# Patient Record
Sex: Male | Born: 1960 | Race: White | Hispanic: No | Smoking: Former smoker
Health system: Southern US, Community
[De-identification: ages and names within clinical notes are randomized; demographics above are authoritative.]

## PROBLEM LIST (undated history)

## (undated) DIAGNOSIS — K5790 Diverticulosis of intestine, part unspecified, without perforation or abscess without bleeding: Secondary | ICD-10-CM

## (undated) DIAGNOSIS — K5780 Diverticulitis of intestine, part unspecified, with perforation and abscess without bleeding: Secondary | ICD-10-CM

## (undated) DIAGNOSIS — K219 Gastro-esophageal reflux disease without esophagitis: Secondary | ICD-10-CM

## (undated) DIAGNOSIS — Z720 Tobacco use: Secondary | ICD-10-CM

## (undated) DIAGNOSIS — Z8719 Personal history of other diseases of the digestive system: Secondary | ICD-10-CM

---

## 1999-02-07 ENCOUNTER — Encounter: Payer: Self-pay | Admitting: Emergency Medicine

## 1999-02-07 ENCOUNTER — Emergency Department (HOSPITAL_COMMUNITY): Admission: EM | Admit: 1999-02-07 | Discharge: 1999-02-07 | Payer: Self-pay | Admitting: Emergency Medicine

## 2003-09-02 ENCOUNTER — Encounter: Admission: RE | Admit: 2003-09-02 | Discharge: 2003-09-02 | Payer: Self-pay | Admitting: Family Medicine

## 2006-09-01 ENCOUNTER — Encounter: Admission: RE | Admit: 2006-09-01 | Discharge: 2006-09-01 | Payer: Self-pay | Admitting: Family Medicine

## 2012-02-08 ENCOUNTER — Encounter (HOSPITAL_COMMUNITY): Payer: Self-pay | Admitting: Family Medicine

## 2012-02-08 ENCOUNTER — Inpatient Hospital Stay (HOSPITAL_COMMUNITY)
Admission: EM | Admit: 2012-02-08 | Discharge: 2012-02-21 | DRG: 330 | Disposition: A | Discharge status: Home Health | Payer: 59 | Attending: Surgery | Admitting: Surgery

## 2012-02-08 ENCOUNTER — Emergency Department (HOSPITAL_COMMUNITY): Payer: 59

## 2012-02-08 ENCOUNTER — Ambulatory Visit (INDEPENDENT_AMBULATORY_CARE_PROVIDER_SITE_OTHER): Payer: 59 | Admitting: Emergency Medicine

## 2012-02-08 VITALS — BP 147/79 | HR 88 | Temp 97.5°F | Resp 18 | Ht 69.75 in | Wt 174.6 lb

## 2012-02-08 DIAGNOSIS — K5792 Diverticulitis of intestine, part unspecified, without perforation or abscess without bleeding: Secondary | ICD-10-CM

## 2012-02-08 DIAGNOSIS — Z8719 Personal history of other diseases of the digestive system: Secondary | ICD-10-CM

## 2012-02-08 DIAGNOSIS — D72829 Elevated white blood cell count, unspecified: Secondary | ICD-10-CM | POA: Diagnosis present

## 2012-02-08 DIAGNOSIS — K56609 Unspecified intestinal obstruction, unspecified as to partial versus complete obstruction: Secondary | ICD-10-CM | POA: Diagnosis not present

## 2012-02-08 DIAGNOSIS — K5732 Diverticulitis of large intestine without perforation or abscess without bleeding: Secondary | ICD-10-CM

## 2012-02-08 DIAGNOSIS — Z720 Tobacco use: Secondary | ICD-10-CM | POA: Diagnosis present

## 2012-02-08 DIAGNOSIS — Y9210 Unspecified residential institution as the place of occurrence of the external cause: Secondary | ICD-10-CM | POA: Diagnosis not present

## 2012-02-08 DIAGNOSIS — K929 Disease of digestive system, unspecified: Secondary | ICD-10-CM | POA: Diagnosis not present

## 2012-02-08 DIAGNOSIS — Z8711 Personal history of peptic ulcer disease: Secondary | ICD-10-CM

## 2012-02-08 DIAGNOSIS — K5790 Diverticulosis of intestine, part unspecified, without perforation or abscess without bleeding: Secondary | ICD-10-CM

## 2012-02-08 DIAGNOSIS — F17200 Nicotine dependence, unspecified, uncomplicated: Secondary | ICD-10-CM | POA: Diagnosis present

## 2012-02-08 DIAGNOSIS — Y833 Surgical operation with formation of external stoma as the cause of abnormal reaction of the patient, or of later complication, without mention of misadventure at the time of the procedure: Secondary | ICD-10-CM | POA: Diagnosis not present

## 2012-02-08 DIAGNOSIS — K630 Abscess of intestine: Secondary | ICD-10-CM | POA: Diagnosis not present

## 2012-02-08 DIAGNOSIS — K560 Paralytic ileus: Secondary | ICD-10-CM | POA: Diagnosis not present

## 2012-02-08 DIAGNOSIS — Z880 Allergy status to penicillin: Secondary | ICD-10-CM

## 2012-02-08 DIAGNOSIS — K5780 Diverticulitis of intestine, part unspecified, with perforation and abscess without bleeding: Secondary | ICD-10-CM | POA: Diagnosis present

## 2012-02-08 HISTORY — DX: Tobacco use: Z72.0

## 2012-02-08 HISTORY — DX: Diverticulosis of intestine, part unspecified, without perforation or abscess without bleeding: K57.90

## 2012-02-08 HISTORY — DX: Personal history of other diseases of the digestive system: Z87.19

## 2012-02-08 HISTORY — DX: Personal history of peptic ulcer disease: Z87.11

## 2012-02-08 HISTORY — DX: Diverticulitis of intestine, part unspecified, with perforation and abscess without bleeding: K57.80

## 2012-02-08 LAB — CBC WITH DIFFERENTIAL/PLATELET
Basophils Absolute: 0 10*3/uL (ref 0.0–0.1)
Basophils Relative: 0 % (ref 0–1)
Eosinophils Absolute: 0 10*3/uL (ref 0.0–0.7)
Eosinophils Relative: 0 % (ref 0–5)
HCT: 43 % (ref 39.0–52.0)
Hemoglobin: 15.2 g/dL (ref 13.0–17.0)
Lymphocytes Relative: 5 % — ABNORMAL LOW (ref 12–46)
Lymphs Abs: 1.1 10*3/uL (ref 0.7–4.0)
MCH: 32.2 pg (ref 26.0–34.0)
MCHC: 35.3 g/dL (ref 30.0–36.0)
MCV: 91.1 fL (ref 78.0–100.0)
Monocytes Absolute: 0.9 10*3/uL (ref 0.1–1.0)
Monocytes Relative: 4 % (ref 3–12)
Neutro Abs: 22.2 10*3/uL — ABNORMAL HIGH (ref 1.7–7.7)
Neutrophils Relative %: 92 % — ABNORMAL HIGH (ref 43–77)
Platelets: 286 10*3/uL (ref 150–400)
RBC: 4.72 MIL/uL (ref 4.22–5.81)
RDW: 12.7 % (ref 11.5–15.5)
WBC: 24.3 10*3/uL — ABNORMAL HIGH (ref 4.0–10.5)

## 2012-02-08 LAB — COMPREHENSIVE METABOLIC PANEL
ALT: 18 U/L (ref 0–53)
AST: 14 U/L (ref 0–37)
Albumin: 4.1 g/dL (ref 3.5–5.2)
Alkaline Phosphatase: 124 U/L — ABNORMAL HIGH (ref 39–117)
BUN: 17 mg/dL (ref 6–23)
CO2: 27 mEq/L (ref 19–32)
Calcium: 10.1 mg/dL (ref 8.4–10.5)
Chloride: 97 mEq/L (ref 96–112)
Creatinine, Ser: 0.91 mg/dL (ref 0.50–1.35)
GFR calc Af Amer: >90 mL/min (ref >90)
GFR calc non Af Amer: >90 mL/min (ref >90)
Glucose, Bld: 129 mg/dL — ABNORMAL HIGH (ref 70–99)
Potassium: 3.8 mEq/L (ref 3.5–5.1)
Sodium: 137 mEq/L (ref 135–145)
Total Bilirubin: 0.8 mg/dL (ref 0.3–1.2)
Total Protein: 8.1 g/dL (ref 6.0–8.3)

## 2012-02-08 LAB — CBC
HCT: 38.2 % — ABNORMAL LOW (ref 39.0–52.0)
MCV: 91 fL (ref 78.0–100.0)
RBC: 4.2 MIL/uL — ABNORMAL LOW (ref 4.22–5.81)
RDW: 12.8 % (ref 11.5–15.5)
WBC: 19 10*3/uL — ABNORMAL HIGH (ref 4.0–10.5)

## 2012-02-08 LAB — LACTIC ACID, PLASMA: Lactic Acid, Venous: 1 mmol/L (ref 0.5–2.2)

## 2012-02-08 LAB — CREATININE, SERUM: GFR calc Af Amer: >90 mL/min (ref >90)

## 2012-02-08 MED ORDER — CIPROFLOXACIN IN D5W 400 MG/200ML IV SOLN
400.0000 mg | Freq: Once | INTRAVENOUS | Status: AC
  Administered 2012-02-08: 400 mg via INTRAVENOUS
  Filled 2012-02-08: qty 200

## 2012-02-08 MED ORDER — FAMOTIDINE IN NACL 20-0.9 MG/50ML-% IV SOLN
20.0000 mg | Freq: Two times a day (BID) | INTRAVENOUS | Status: DC
  Administered 2012-02-08 – 2012-02-14 (×12): 20 mg via INTRAVENOUS
  Filled 2012-02-08 (×19): qty 50

## 2012-02-08 MED ORDER — ONDANSETRON HCL 4 MG/2ML IJ SOLN
4.0000 mg | Freq: Four times a day (QID) | INTRAMUSCULAR | Status: DC | PRN
  Administered 2012-02-08 – 2012-02-14 (×12): 4 mg via INTRAVENOUS
  Filled 2012-02-08 (×13): qty 2

## 2012-02-08 MED ORDER — METRONIDAZOLE IN NACL 5-0.79 MG/ML-% IV SOLN
500.0000 mg | Freq: Once | INTRAVENOUS | Status: AC
Start: 2012-02-08 — End: 2012-02-08
  Administered 2012-02-08: 500 mg via INTRAVENOUS

## 2012-02-08 MED ORDER — ONDANSETRON HCL 4 MG/2ML IJ SOLN
4.0000 mg | Freq: Once | INTRAMUSCULAR | Status: AC
  Administered 2012-02-08: 4 mg via INTRAVENOUS
  Filled 2012-02-08: qty 2

## 2012-02-08 MED ORDER — MORPHINE SULFATE 2 MG/ML IJ SOLN
1.0000 mg | INTRAMUSCULAR | Status: DC | PRN
  Administered 2012-02-08 (×2): 2 mg via INTRAVENOUS
  Filled 2012-02-08 (×2): qty 1

## 2012-02-08 MED ORDER — SODIUM CHLORIDE 0.9 % IV BOLUS (SEPSIS)
1000.0000 mL | Freq: Once | INTRAVENOUS | Status: AC
  Administered 2012-02-08: 1000 mL via INTRAVENOUS

## 2012-02-08 MED ORDER — ENOXAPARIN SODIUM 40 MG/0.4ML ~~LOC~~ SOLN
40.0000 mg | SUBCUTANEOUS | Status: DC
  Administered 2012-02-08 – 2012-02-13 (×6): 40 mg via SUBCUTANEOUS
  Filled 2012-02-08 (×8): qty 0.4

## 2012-02-08 MED ORDER — IOHEXOL 300 MG/ML  SOLN
100.0000 mL | Freq: Once | INTRAMUSCULAR | Status: AC | PRN
  Administered 2012-02-08: 100 mL via INTRAVENOUS

## 2012-02-08 MED ORDER — DIPHENHYDRAMINE HCL 50 MG/ML IJ SOLN: 12.5000 mg | Freq: Four times a day (QID) | INTRAMUSCULAR | Status: DC | PRN

## 2012-02-08 MED ORDER — ACETAMINOPHEN 650 MG RE SUPP: 650.0000 mg | Freq: Four times a day (QID) | RECTAL | Status: DC | PRN

## 2012-02-08 MED ORDER — HYDROMORPHONE HCL PF 1 MG/ML IJ SOLN
1.0000 mg | Freq: Once | INTRAMUSCULAR | Status: AC
  Administered 2012-02-08: 1 mg via INTRAVENOUS
  Filled 2012-02-08: qty 1

## 2012-02-08 MED ORDER — METRONIDAZOLE IN NACL 5-0.79 MG/ML-% IV SOLN
500.0000 mg | Freq: Three times a day (TID) | INTRAVENOUS | Status: DC
  Administered 2012-02-08 – 2012-02-12 (×11): 500 mg via INTRAVENOUS
  Filled 2012-02-08 (×15): qty 100

## 2012-02-08 MED ORDER — IOHEXOL 300 MG/ML  SOLN: 20.0000 mL | INTRAMUSCULAR | Status: AC

## 2012-02-08 MED ORDER — DIPHENHYDRAMINE HCL 12.5 MG/5ML PO ELIX
12.5000 mg | ORAL_SOLUTION | Freq: Four times a day (QID) | ORAL | Status: DC | PRN
  Filled 2012-02-08: qty 10

## 2012-02-08 MED ORDER — ONDANSETRON HCL 4 MG/2ML IJ SOLN
INTRAMUSCULAR | Status: AC
  Administered 2012-02-08: 4 mg via INTRAVENOUS
  Filled 2012-02-08: qty 2

## 2012-02-08 MED ORDER — ONDANSETRON HCL 4 MG/2ML IJ SOLN
4.0000 mg | Freq: Once | INTRAMUSCULAR | Status: AC
  Administered 2012-02-08: 4 mg via INTRAVENOUS

## 2012-02-08 MED ORDER — HYDROCODONE-ACETAMINOPHEN 5-325 MG PO TABS
1.0000 | ORAL_TABLET | ORAL | Status: DC | PRN
  Administered 2012-02-09 – 2012-02-13 (×2): 2 via ORAL
  Filled 2012-02-08 (×3): qty 2

## 2012-02-08 MED ORDER — CIPROFLOXACIN IN D5W 400 MG/200ML IV SOLN
400.0000 mg | Freq: Two times a day (BID) | INTRAVENOUS | Status: DC
  Administered 2012-02-09 – 2012-02-12 (×6): 400 mg via INTRAVENOUS
  Filled 2012-02-08 (×7): qty 200

## 2012-02-08 MED ORDER — POTASSIUM CHLORIDE IN NACL 20-0.9 MEQ/L-% IV SOLN
INTRAVENOUS | Status: DC
  Administered 2012-02-08 – 2012-02-10 (×4): via INTRAVENOUS
  Filled 2012-02-08 (×9): qty 1000

## 2012-02-08 MED ORDER — ACETAMINOPHEN 325 MG PO TABS
650.0000 mg | ORAL_TABLET | Freq: Four times a day (QID) | ORAL | Status: DC | PRN
  Administered 2012-02-08 – 2012-02-14 (×3): 650 mg via ORAL
  Filled 2012-02-08 (×3): qty 2

## 2012-02-08 NOTE — ED Notes (Signed)
Pt tolerating contrast well.

## 2012-02-08 NOTE — Progress Notes (Signed)
Patient adm. To rm [id number] via stretcher from ED. A/O, VSS, denies pain, no c/o nausea, ice chips given, patient oriented to room/unit. Wife at bedside.Lurline Idol [facility]

## 2012-02-08 NOTE — H&P (Signed)
Philip Jimenez is an 51 y.o. male.   Chief Complaint: Abdominal pain nausea and vomiting. GI: Dr. Marisue Brooklyn HPI: Patient is a 52 year old gentleman who's been in good health. On Tuesday he started having abdominal pain which got better after a bowel movement. He felt better on Tuesday the pain returned again on Thursday and Friday. Pain is significant her just to get out of bed. He has a history of diverticulitis and was treated back in December 2012 and subsequently underwent colonoscopy Dr. Loreta Ave in January 2013. He's continue to have worsening pain and chills no fever. Today he developed nausea vomiting and diarrhea. He presented to the ER at [facility]. WBC is 24,300. Hemoglobin 15 hematocrit 43 platelets 286,000 electrolytes and LFTs are all normal creatinine 0.91. CT scan shows significant sigmoid diverticulitis with evidence of microperforation with several small extraluminal locules of air. There is some prominent secondary inflammation and adjacent loop of the distal ileum, which may be a future abscess. Currently there is nothing drainable or obvious abscess at this time. Also has a small hiatal hernia and a left renal cyst.    Past Medical History  Diagnosis Date  . Tobacco use 02/08/2012    Less than 1ppd for 36 years  . Hx of gastric ulcer with bleeding about 20 years agol 02/08/2012  . Diverticulosis 02/08/2012    With diverticulitis DEC 2012. Colonoscopy Jan 2013 DR. Mann  . Diverticulitis with perforation 02/08/2012    History reviewed. No pertinent past surgical history.  History reviewed. No pertinent family history. Social History:  reports that he has been smoking.  He does not have any smokeless tobacco history on file. He reports that he does not drink alcohol. His drug history not on file.  Allergies:  Allergies  Allergen Reactions  . Penicillins Hives   Prior to Admission medications   Medication Sig Start Date End Date Taking? Authorizing Provider    acetaminophen (TYLENOL) 500 MG tablet Take 1,000 mg by mouth every 6 (six) hours as needed. For pain   Yes Historical Provider, MD  esomeprazole (NEXIUM) 40 MG capsule Take 40 mg by mouth daily before breakfast.   Yes Historical Provider, MD    (Not in a hospital admission)  Results for orders placed during the hospital encounter of 02/08/12 (from the past 48 hour(s))  CBC WITH DIFFERENTIAL     Status: Abnormal   Collection Time   02/08/12 10:01 AM      Component Value Range Comment   WBC 24.3 (*) 4.0 - 10.5 K/uL    RBC 4.72  4.22 - 5.81 MIL/uL    Hemoglobin 15.2  13.0 - 17.0 g/dL    HCT 13.0  86.5 - 78.4 %    MCV 91.1  78.0 - 100.0 fL    MCH 32.2  26.0 - 34.0 pg    MCHC 35.3  30.0 - 36.0 g/dL    RDW 69.6  29.5 - 28.4 %    Platelets 286  150 - 400 K/uL    Neutrophils Relative 92 (*) 43 - 77 %    Neutro Abs 22.2 (*) 1.7 - 7.7 K/uL    Lymphocytes Relative 5 (*) 12 - 46 %    Lymphs Abs 1.1  0.7 - 4.0 K/uL    Monocytes Relative 4  3 - 12 %    Monocytes Absolute 0.9  0.1 - 1.0 K/uL    Eosinophils Relative 0  0 - 5 %    Eosinophils Absolute 0.0  0.0 - 0.7 K/uL    Basophils Relative 0  0 - 1 %    Basophils Absolute 0.0  0.0 - 0.1 K/uL   COMPREHENSIVE METABOLIC PANEL     Status: Abnormal   Collection Time   02/08/12 10:01 AM      Component Value Range Comment   Sodium 137  135 - 145 mEq/L    Potassium 3.8  3.5 - 5.1 mEq/L    Chloride 97  96 - 112 mEq/L    CO2 27  19 - 32 mEq/L    Glucose, Bld 129 (*) 70 - 99 mg/dL    BUN 17  6 - 23 mg/dL    Creatinine, Ser 1.61  0.50 - 1.35 mg/dL    Calcium 09.6  8.4 - 10.5 mg/dL    Total Protein 8.1  6.0 - 8.3 g/dL    Albumin 4.1  3.5 - 5.2 g/dL    AST 14  0 - 37 U/L    ALT 18  0 - 53 U/L    Alkaline Phosphatase 124 (*) 39 - 117 U/L    Total Bilirubin 0.8  0.3 - 1.2 mg/dL    GFR calc non Af Amer >90  >90 mL/min    GFR calc Af Amer >90  >90 mL/min   LIPASE, BLOOD     Status: Normal   Collection Time   02/08/12 10:01 AM      Component Value  Range Comment   Lipase 13  11 - 59 U/L   LACTIC ACID, PLASMA     Status: Normal   Collection Time   02/08/12 11:10 AM      Component Value Range Comment   Lactic Acid, Venous 1.0  0.5 - 2.2 mmol/L    Ct Abdomen Pelvis W Contrast  02/08/2012  *RADIOLOGY REPORT*  Clinical Data: Abdominal pain.  CT ABDOMEN AND PELVIS WITH CONTRAST  Technique:  Multidetector CT imaging of the abdomen and pelvis was performed following the standard protocol during bolus administration of intravenous contrast.  Contrast: OMNIPAQUE IOHEXOL 300 MG/ML  SOLN  Comparison: None.  Findings: Dependent subsegmental atelectasis noted in the lower lobes.  Small hiatal hernia noted.  The liver, spleen, pancreas, and adrenal glands appear unremarkable.  The gallbladder and biliary system appear unremarkable.  No pathologic retroperitoneal or porta hepatis adenopathy is identified.  Right kidney lower pole exophytic cyst measures 2.3 cm in long axis.  Right kidney upper pole scar noted.  Left kidney unremarkable.  There is prominent sigmoid diverticulitis, with several tiny locules of extraluminal gas indicative of microperforation. Prominent wall thickening of the adjacent loop of distal ileum favors prominent secondary inflammation.  The terminal most ileum does not appear inflamed.  A tiny collection of gas and fluid adjacent to this loop is noted anteriorly on image 71 of series 2. Surrounding mesenteric and omental edema.  Urinary bladder unremarkable.    Central prostate gland calcifications noted.  Appendix normal.  Scattered colonic diverticula especially in the descending colon.  IMPRESSION: 1.  Prominent sigmoid diverticulitis with evidence of microperforation and several small extraluminal locules of gas. There is prominent secondary inflammation of the adjacent loop of distal ileum. Suspected incipient abscess formation anteriorly.  2.  Small hiatal hernia.  3.  Right kidney upper pole chronic scar.  4.  Small right kidney  lower pole exophytic cyst.  5.  Mild aortoiliac atherosclerotic calcification.   Original Report Authenticated By: Dellia Cloud, M.D.     Review of  Systems  Constitutional: Positive for chills and weight loss. Negative for fever, malaise/fatigue and diaphoresis.  HENT: Negative.   Eyes: Negative.   Respiratory: Negative.   Cardiovascular: Negative.   Gastrointestinal: Positive for heartburn, nausea, vomiting, abdominal pain and diarrhea. Negative for constipation, blood in stool and melena.  Genitourinary: Dysuria: 8 pounds last week.       Decreased urine output  Musculoskeletal: Negative.   Skin: Negative.   Neurological: Negative.  Negative for weakness.  Endo/Heme/Allergies: Negative.   Psychiatric/Behavioral: Negative.     Blood pressure 115/73, pulse 72, temperature 98.3 F (36.8 C), temperature source Oral, resp. rate 16, SpO2 97.00%. Physical Exam  Constitutional: He is oriented to person, place, and time. He appears well-developed. No distress.  HENT:  Head: Normocephalic and atraumatic.  Nose: Nose normal.  Eyes: Conjunctivae normal and EOM are normal. Pupils are equal, round, and reactive to light. Right eye exhibits no discharge. Left eye exhibits no discharge. No scleral icterus.  Neck: Normal range of motion. Neck supple. No JVD present. No tracheal deviation present. No thyromegaly present.  Cardiovascular: Regular rhythm, normal heart sounds and intact distal pulses.  Exam reveals no gallop.   No murmur heard.      Heart rate upper 90's now.  Respiratory: Effort normal and breath sounds normal. No stridor. No respiratory distress. He has no wheezes. He has no rales. He exhibits no tenderness.  GI: Soft. Bowel sounds are normal. He exhibits distension. He exhibits no mass (tender Both right and left, lower quadrantsmore on right than left right now.). There is tenderness. There is no rebound. Guarding: very tender more on right than left.  Musculoskeletal:  Normal range of motion. He exhibits no edema.  Lymphadenopathy:    He has no cervical adenopathy.  Neurological: He is alert and oriented to person, place, and time. He has normal reflexes. A cranial nerve deficit is present.  Skin: Skin is warm and dry. No rash noted. He is not diaphoretic. No erythema. There is pallor.       Feels like he has temp, oral temp 98.9  Psychiatric: He has a normal mood and affect. His behavior is normal. Judgment and thought content normal.     Assessment/Plan 1. Acute diverticulitis with microperforation 2. Diverticulitis December 2012, colonoscopy January 2013 Dr. Loreta Ave 3. History of GI bleed 20 years ago. 4. Tobacco Use  Plan: Will admit the patient. IV hydration, bowel rest, and IV antibiotic. We'll reassess tomorrow at this point we anticipate medical management. [facility] physician assistant for Dr. Gaynelle Adu.  Alessandra Sawdey 02/08/2012, 4:17 PM

## 2012-02-08 NOTE — ED Notes (Signed)
Pt knows that urine is needed. Pt is still unable to void at this time 

## 2012-02-08 NOTE — ED Notes (Signed)
Pt reports generalized abdominal pain after not having a BM for 3-4 days on Tuesday, reports taking prune juice and was able to have BM and felt better. Reports yesterday starting having severe abdominal pain again with n/v/d and mild fever. At this time pt states he feels better than he did yesterday but still has severe abdominal pain, right worse than left, severe tenderness with palpation.

## 2012-02-08 NOTE — ED Notes (Signed)
Dr. Wilson in to assess pt. 

## 2012-02-08 NOTE — Progress Notes (Addendum)
   Date:  02/08/2012   Name:  Philip Jimenez   DOB:  03-23-1961   MRN:  [id number] Gender: male Age: 51 y.o.  PCP:  Charna Elizabeth, MD    Chief Complaint: Diverticulitis and Emesis   History of Present Illness:  Philip Jimenez is a 51 y.o. pleasant patient who presents with the following:  Ill since Tuesday when constipated.  Worsened over past several days with increased abdominal pain.  Yesterday morning had a loose stool and today has had three loose stools and vomited three or more times.  No appetite and poor po intake. No fever or chills.  Now feels quite ill per patient.  Has a history of diverticulosis by colonoscopy.  No surgical history.  Prior episode of diverticulitis in January.  No vomiting blood, blood in stools or melena.  There is no problem list on file for this patient.   No past medical history on file.  No past surgical history on file.  History  Substance Use Topics  . Smoking status: Current Every Day Smoker  . Smokeless tobacco: Not on file  . Alcohol Use: Not on file    No family history on file.  Allergies  Allergen Reactions  . Penicillins Hives    Medication list has been reviewed and updated.  No outpatient prescriptions prior to visit.    Review of Systems:  As per HPI, otherwise negative.    Physical Examination: Filed Vitals:   02/08/12 0828  BP: 147/79  Pulse: 88  Temp: 97.5 F (36.4 C)  Resp: 18   Filed Vitals:   02/08/12 0828  Height: 5' 9.75" (1.772 m)  Weight: 174 lb 9.6 oz (79.198 kg)   Body mass index is 25.23 kg/(m^2). Ideal Body Weight: Weight in (lb) to have BMI = 25: 172.6   GEN: WDWN, NAD, Non-toxic, A & O x 3 HEENT: Atraumatic, Normocephalic. Neck supple. No masses, No LAD.  Oropharyngeal mucosa dry Ears and Nose: No external deformity. CV: RRR, No M/G/R. No JVD. No thrill. No extra heart sounds. PULM: CTA B, no wheezes, crackles, rhonchi. No retractions. No resp. distress. No accessory muscle use. ABD:  S,  No HSM.  Generally tender and guarding.  BS active.  Borborygmi in lower abdomen.  Tenderness worse in lower right and left abdomen EXTR: No c/c/e NEURO Normal gait.  PSYCH: Normally interactive. Conversant. Not depressed or anxious appearing.  Calm demeanor.    Assessment and Plan: Acute abdomen Dehydration. To ER  Carmelina Dane, MD I have reviewed and agree with documentation. Robert P. Merla Riches, M.D.

## 2012-02-08 NOTE — ED Provider Notes (Signed)
History     CSN: [id number]  Arrival date & time 02/08/12  0932   First MD Initiated Contact with Patient 02/08/12 1023      Chief Complaint  Patient presents with  . Abdominal Pain    (Consider location/radiation/quality/duration/timing/severity/associated sxs/prior treatment) HPI The patient presents with concerns of abdominal pain.  He notes that his pain began approximately 4 days ago, has been waxing and waning since that time.  The pain is diffuse, crampy, seemingly worse following by mouth intake.  He notes associated nausea, vomiting, diarrhea.  He also notes subjective fever, chills. He denies chest pain, dyspnea, headache, confusion. No clear alleviating factors. The patient has a history of diverticulitis, with medical management approximately 9 months ago. History reviewed. No pertinent past medical history.  History reviewed. No pertinent past surgical history.  History reviewed. No pertinent family history.  History  Substance Use Topics  . Smoking status: Current Every Day Smoker  . Smokeless tobacco: Not on file  . Alcohol Use: No      Review of Systems  Constitutional:       Per HPI, otherwise negative  HENT:       Per HPI, otherwise negative  Eyes: Negative.   Respiratory:       Per HPI, otherwise negative  Cardiovascular:       Per HPI, otherwise negative  Gastrointestinal: Positive for nausea, vomiting, abdominal pain and diarrhea. Negative for blood in stool.  Genitourinary: Negative.   Musculoskeletal:       Per HPI, otherwise negative  Skin: Negative.   Neurological: Negative for syncope.    Allergies  Penicillins  Home Medications   Current Outpatient Rx  Name Route Sig Dispense Refill  . ACETAMINOPHEN 500 MG PO TABS Oral Take 1,000 mg by mouth every 6 (six) hours as needed. For pain    . ESOMEPRAZOLE MAGNESIUM 40 MG PO CPDR Oral Take 40 mg by mouth daily before breakfast.      BP 152/87  Pulse 94  Temp 98.3 F (36.8 C) (Oral)   Resp 16  SpO2 96%  Physical Exam  Nursing note and vitals reviewed. Constitutional: He is oriented to person, place, and time. He appears well-developed. No distress.  HENT:  Head: Normocephalic and atraumatic.  Eyes: Conjunctivae normal and EOM are normal.  Cardiovascular: Normal rate and regular rhythm.   Pulmonary/Chest: Effort normal. No stridor. No respiratory distress.  Abdominal: Normal appearance. He exhibits no distension. There is generalized tenderness. There is guarding. There is no rigidity.  Musculoskeletal: He exhibits no edema.  Neurological: He is alert and oriented to person, place, and time.  Skin: Skin is warm and dry.  Psychiatric: He has a normal mood and affect.    ED Course  Procedures (including critical care time)  Labs Reviewed  CBC WITH DIFFERENTIAL - Abnormal; Notable for the following:    WBC 24.3 (*)     Neutrophils Relative 92 (*)     Neutro Abs 22.2 (*)     Lymphocytes Relative 5 (*)     All other components within normal limits  COMPREHENSIVE METABOLIC PANEL - Abnormal; Notable for the following:    Glucose, Bld 129 (*)     Alkaline Phosphatase 124 (*)     All other components within normal limits  LIPASE, BLOOD  URINALYSIS, ROUTINE W REFLEX MICROSCOPIC  LACTIC ACID, PLASMA   No results found.   No diagnosis found.  Pulse ox: 99%ra  1500: I discussed the case with Dr.  Wilson.  MDM  This patient with a prior episode of diverticulitis now presents with new abdominal pain, nausea, vomiting.  On exam he is uncomfortable appearing, with a tender abdomen, without frank peritonitis.  Given his history, description of symptoms, this concern was diverticulitis, versus other inflammatory or infectious etiology.  The patient's CT scan demonstrates findings consistent with diverticulitis, with possible early abscess formation and extraluminal hair.  Given his leukocytosis, he was started on antibiotics, I discussed the case with surgery.  The  patient will be admitted for further evaluation and management.  Gerhard Munch, MD 02/08/12 [phone]

## 2012-02-08 NOTE — ED Notes (Signed)
Pt knows that urine is needed. Pt is unable to void at this time.  

## 2012-02-08 NOTE — ED Notes (Signed)
Pt complaining of generalized abdominal pain with N,V,D severe since yesterday.

## 2012-02-08 NOTE — H&P (Signed)
I saw the patient, participated in the history, exam and medical decision making, and concur with the physician assistant's note above.  History as above.  Vital stable Alert, nad, nontoxic Soft, diffuse TTP. More TTP in LLQ/suprapubic. Some guarding. No RT or peritonitis.   Sigmoid diverticulitis with microperforation.  No gross free air. Non-toxic appearing. Vitals stable. No peritonitis. Will start with non-surgical management. Discussed diverticulitis with pt and wife. Drew diagrams. Discussed non-surgical and surgical management. We will initiate non-surgical management for now. Explained to pt that if he gets "sicker" we will have to alter treatment plan (surgery)   Mary Sella. Andrey Campanile, MD, FACS General, Bariatric, & Minimally Invasive Surgery [facility], [place]

## 2012-02-09 LAB — URINALYSIS, ROUTINE W REFLEX MICROSCOPIC
Glucose, UA: NEGATIVE mg/dL
Protein, ur: 30 mg/dL — AB
Specific Gravity, Urine: >1.046 — ABNORMAL HIGH (ref 1.005–1.030)
Urobilinogen, UA: 1 mg/dL (ref 0.0–1.0)

## 2012-02-09 LAB — CBC
HCT: 42.7 % (ref 39.0–52.0)
Hemoglobin: 14.7 g/dL (ref 13.0–17.0)
MCV: 92.6 fL (ref 78.0–100.0)
RBC: 4.61 MIL/uL (ref 4.22–5.81)
WBC: 25.6 10*3/uL — ABNORMAL HIGH (ref 4.0–10.5)

## 2012-02-09 LAB — BASIC METABOLIC PANEL
CO2: 23 mEq/L (ref 19–32)
Chloride: 99 mEq/L (ref 96–112)
Sodium: 137 mEq/L (ref 135–145)

## 2012-02-09 LAB — URINE MICROSCOPIC-ADD ON

## 2012-02-09 MED ORDER — HYDROMORPHONE HCL PF 1 MG/ML IJ SOLN
0.5000 mg | INTRAMUSCULAR | Status: DC | PRN
  Administered 2012-02-09: 0.5 mg via INTRAVENOUS
  Administered 2012-02-09 – 2012-02-14 (×18): 1 mg via INTRAVENOUS
  Filled 2012-02-09 (×21): qty 1

## 2012-02-09 MED ORDER — PROMETHAZINE HCL 25 MG/ML IJ SOLN
12.5000 mg | INTRAMUSCULAR | Status: DC | PRN
  Administered 2012-02-09 – 2012-02-10 (×4): 12.5 mg via INTRAVENOUS
  Filled 2012-02-09 (×7): qty 1

## 2012-02-09 NOTE — Progress Notes (Signed)
Subjective: Feels better, having less pain in bed and moving.  Still having some nausea, and pain.  Says morphine makes him feel bad.  Objective: Vital signs in last 24 hours: Temp:  [97.5 F (36.4 C)-100.2 F (37.9 C)] 97.9 F (36.6 C) (09/15 0630) Pulse Rate:  [72-94] 87  (09/15 0630) Resp:  [16-24] 20  (09/15 0630) BP: (103-152)/(66-87) 131/85 mmHg (09/15 0630) SpO2:  [94 %-99 %] 98 % (09/15 0630) Weight:  [78.926 kg (174 lb)-79.198 kg (174 lb 9.6 oz)] 78.926 kg (174 lb) (09/15 0150) Last BM Date: 02/07/12  Diet: npo, WBC is still up. Tm 100.2 better this AM Intake/Output from previous day: 09/14 0701 - 09/15 0700 In: 900 [I.V.:900] Out: 550 [Urine:550] Intake/Output this shift:    General appearance: alert, cooperative and no distress Resp: clear to auscultation bilaterally GI: soft, less tender than yesterday, still having some pain, more mid abdomen this AM.  Lab Results:   Basename 02/09/12 0420 02/08/12 1614  WBC 25.6* 19.0*  HGB 14.7 13.3  HCT 42.7 38.2*  PLT 277 227    BMET  Basename 02/09/12 0420 02/08/12 1614 02/08/12 1001  NA 137 -- 137  K 3.9 -- 3.8  CL 99 -- 97  CO2 23 -- 27  GLUCOSE 88 -- 129*  BUN 14 -- 17  CREATININE 0.85 0.78 --  CALCIUM 9.6 -- 10.1   PT/INR No results found for this basename: LABPROT:2,INR:2 in the last 72 hours   Lab 02/08/12 1001  AST 14  ALT 18  ALKPHOS 124*  BILITOT 0.8  PROT 8.1  ALBUMIN 4.1     Lipase     Component Value Date/Time   LIPASE 13 02/08/2012 1001     Studies/Results: Ct Abdomen Pelvis W Contrast  02/08/2012  *RADIOLOGY REPORT*  Clinical Data: Abdominal pain.  CT ABDOMEN AND PELVIS WITH CONTRAST  Technique:  Multidetector CT imaging of the abdomen and pelvis was performed following the standard protocol during bolus administration of intravenous contrast.  Contrast: OMNIPAQUE IOHEXOL 300 MG/ML  SOLN  Comparison: None.  Findings: Dependent subsegmental atelectasis noted in the lower  lobes.  Small hiatal hernia noted.  The liver, spleen, pancreas, and adrenal glands appear unremarkable.  The gallbladder and biliary system appear unremarkable.  No pathologic retroperitoneal or porta hepatis adenopathy is identified.  Right kidney lower pole exophytic cyst measures 2.3 cm in long axis.  Right kidney upper pole scar noted.  Left kidney unremarkable.  There is prominent sigmoid diverticulitis, with several tiny locules of extraluminal gas indicative of microperforation. Prominent wall thickening of the adjacent loop of distal ileum favors prominent secondary inflammation.  The terminal most ileum does not appear inflamed.  A tiny collection of gas and fluid adjacent to this loop is noted anteriorly on image 71 of series 2. Surrounding mesenteric and omental edema.  Urinary bladder unremarkable.    Central prostate gland calcifications noted.  Appendix normal.  Scattered colonic diverticula especially in the descending colon.  IMPRESSION: 1.  Prominent sigmoid diverticulitis with evidence of microperforation and several small extraluminal locules of gas. There is prominent secondary inflammation of the adjacent loop of distal ileum. Suspected incipient abscess formation anteriorly.  2.  Small hiatal hernia.  3.  Right kidney upper pole chronic scar.  4.  Small right kidney lower pole exophytic cyst.  5.  Mild aortoiliac atherosclerotic calcification.   Original Report Authenticated By: Dellia Cloud, M.D.     Medications:    . ciprofloxacin  400  mg Intravenous Once  . ciprofloxacin  400 mg Intravenous Q12H  . enoxaparin  40 mg Subcutaneous Q24H  . famotidine (PEPCID) IV  20 mg Intravenous Q12H  .  HYDROmorphone (DILAUDID) injection  1 mg Intravenous Once  .  HYDROmorphone (DILAUDID) injection  1 mg Intravenous Once  . iohexol  20 mL Oral Q1 Hr x 2  . metronidazole  500 mg Intravenous Once  . metronidazole  500 mg Intravenous Q8H  . ondansetron (ZOFRAN) IV  4 mg Intravenous Once    . ondansetron (ZOFRAN) IV  4 mg Intravenous Once  . ondansetron (ZOFRAN) IV  4 mg Intravenous Once  . sodium chloride  1,000 mL Intravenous Once    Assessment/Plan 1. Acute diverticulitis with microperforation  2. Diverticulitis December 2012, colonoscopy January 2013 Dr. Loreta Ave  3. History of GI bleed 20 years ago.  4. Tobacco Use  Plan:  D/c Morphine, change to dilaudid, continue antibiotics, add phenergan, continue to hydrate, give him some extra fluid today. Recheck labs AM.       LOS: 1 day    Yossi Hinchman 02/09/2012

## 2012-02-09 NOTE — Plan of Care (Signed)
Problem: Phase I Progression Outcomes Goal: OOB as tolerated unless otherwise ordered Outcome: Progressing Bathroom privilages

## 2012-02-09 NOTE — Progress Notes (Signed)
Agree with above. -Bowel rest -Abx

## 2012-02-10 LAB — BASIC METABOLIC PANEL
BUN: 15 mg/dL (ref 6–23)
Chloride: 101 mEq/L (ref 96–112)
GFR calc Af Amer: >90 mL/min (ref >90)
Glucose, Bld: 122 mg/dL — ABNORMAL HIGH (ref 70–99)
Potassium: 4 mEq/L (ref 3.5–5.1)

## 2012-02-10 LAB — CBC
HCT: 38.1 % — ABNORMAL LOW (ref 39.0–52.0)
Hemoglobin: 12.9 g/dL — ABNORMAL LOW (ref 13.0–17.0)
WBC: 17.3 10*3/uL — ABNORMAL HIGH (ref 4.0–10.5)

## 2012-02-10 MED ORDER — KCL IN DEXTROSE-NACL 20-5-0.9 MEQ/L-%-% IV SOLN
INTRAVENOUS | Status: DC
  Administered 2012-02-10 – 2012-02-11 (×3): via INTRAVENOUS
  Administered 2012-02-12: 1000 mL via INTRAVENOUS
  Filled 2012-02-10 (×9): qty 1000

## 2012-02-10 NOTE — Progress Notes (Signed)
Subjective: Has significantly less pain.  This is his first episode.  He can take Ampicillin.  Objective: Vital signs in last 24 hours: Temp:  [98.5 F (36.9 C)-99.1 F (37.3 C)] 98.8 F (37.1 C) (09/16 0603) Pulse Rate:  [73-93] 80  (09/16 0603) Resp:  [18-19] 18  (09/16 0603) BP: (101-122)/(60-86) 122/79 mmHg (09/16 0603) SpO2:  [96 %-98 %] 96 % (09/16 0603) Last BM Date: 02/07/12  Intake/Output from previous day: 09/15 0701 - 09/16 0700 In: 4008 [I.V.:3608; IV Piggyback:400] Out: -  Intake/Output this shift:    PE: Abd-soft, no significant tenderness  Lab Results:   [facility] 02/10/12 0610 02/09/12 0420  WBC 17.3* 25.6*  HGB 12.9* 14.7  HCT 38.1* 42.7  PLT 266 277   BMET  Basename 02/10/12 0610 02/09/12 0420  NA 138 137  K 4.0 3.9  CL 101 99  CO2 26 23  GLUCOSE 122* 88  BUN 15 14  CREATININE 0.85 0.85  CALCIUM 9.1 9.6   PT/INR No results found for this basename: LABPROT:2,INR:2 in the last 72 hours Comprehensive Metabolic Panel:    Component Value Date/Time   NA 138 02/10/2012 0610   K 4.0 02/10/2012 0610   CL 101 02/10/2012 0610   CO2 26 02/10/2012 0610   BUN 15 02/10/2012 0610   CREATININE 0.85 02/10/2012 0610   GLUCOSE 122* 02/10/2012 0610   CALCIUM 9.1 02/10/2012 0610   AST 14 02/08/2012 1001   ALT 18 02/08/2012 1001   ALKPHOS 124* 02/08/2012 1001   BILITOT 0.8 02/08/2012 1001   PROT 8.1 02/08/2012 1001   ALBUMIN 4.1 02/08/2012 1001     Studies/Results: Ct Abdomen Pelvis W Contrast  02/08/2012  *RADIOLOGY REPORT*  Clinical Data: Abdominal pain.  CT ABDOMEN AND PELVIS WITH CONTRAST  Technique:  Multidetector CT imaging of the abdomen and pelvis was performed following the standard protocol during bolus administration of intravenous contrast.  Contrast: OMNIPAQUE IOHEXOL 300 MG/ML  SOLN  Comparison: None.  Findings: Dependent subsegmental atelectasis noted in the lower lobes.  Small hiatal hernia noted.  The liver, spleen, pancreas, and adrenal  glands appear unremarkable.  The gallbladder and biliary system appear unremarkable.  No pathologic retroperitoneal or porta hepatis adenopathy is identified.  Right kidney lower pole exophytic cyst measures 2.3 cm in long axis.  Right kidney upper pole scar noted.  Left kidney unremarkable.  There is prominent sigmoid diverticulitis, with several tiny locules of extraluminal gas indicative of microperforation. Prominent wall thickening of the adjacent loop of distal ileum favors prominent secondary inflammation.  The terminal most ileum does not appear inflamed.  A tiny collection of gas and fluid adjacent to this loop is noted anteriorly on image 71 of series 2. Surrounding mesenteric and omental edema.  Urinary bladder unremarkable.    Central prostate gland calcifications noted.  Appendix normal.  Scattered colonic diverticula especially in the descending colon.  IMPRESSION: 1.  Prominent sigmoid diverticulitis with evidence of microperforation and several small extraluminal locules of gas. There is prominent secondary inflammation of the adjacent loop of distal ileum. Suspected incipient abscess formation anteriorly.  2.  Small hiatal hernia.  3.  Right kidney upper pole chronic scar.  4.  Small right kidney lower pole exophytic cyst.  5.  Mild aortoiliac atherosclerotic calcification.   Original Report Authenticated By: Dellia Cloud, M.D.     Anti-infectives: Anti-infectives     Start     Dose/Rate Route Frequency Ordered Stop   02/09/12 1400   ciprofloxacin (  CIPRO) IVPB 400 mg        400 mg 200 mL/hr over 60 Minutes Intravenous Every 12 hours 02/08/12 1556     02/08/12 2200   metroNIDAZOLE (FLAGYL) IVPB 500 mg        500 mg 100 mL/hr over 60 Minutes Intravenous Every 8 hours 02/08/12 1556     02/08/12 1445   ciprofloxacin (CIPRO) IVPB 400 mg        400 mg 200 mL/hr over 60 Minutes Intravenous  Once 02/08/12 1437 02/08/12 1545   02/08/12 1445   metroNIDAZOLE (FLAGYL) IVPB 500 mg         500 mg 100 mL/hr over 60 Minutes Intravenous  Once 02/08/12 1437 02/08/12 1935          Assessment Principal Problem:  *Diverticulitis with microperforation-clinically improving on IV abxs. Active Problems:  Tobacco use  Hx of gastric ulcer  Diverticulosis    LOS: 2 days   Plan: Continue abxs.  Sips of clear liquids.  Repeat CBC tomorrow.   Philip Jimenez 02/10/2012

## 2012-02-10 NOTE — Progress Notes (Signed)
UR complete 

## 2012-02-10 NOTE — Progress Notes (Signed)
INITIAL ADULT NUTRITION ASSESSMENT Date: 02/10/2012   Time: 5:10 PM Reason for Assessment: MST  ASSESSMENT: Male 51 y.o.  Dx: Diverticulitis with perforation  Hx:  Past Medical History  Diagnosis Date  . Tobacco use 02/08/2012    Less than 1ppd for 36 years  . Hx of gastric ulcer 02/08/2012  . Diverticulosis 02/08/2012    With diverticulitis DEC 2012. Colonoscopy Jan 2013 DR. Mann  . Diverticulitis with perforation 02/08/2012   History reviewed. No pertinent past surgical history.  Related Meds:  Scheduled Meds:   . ciprofloxacin  400 mg Intravenous Q12H  . enoxaparin  40 mg Subcutaneous Q24H  . famotidine (PEPCID) IV  20 mg Intravenous Q12H  . metronidazole  500 mg Intravenous Q8H   Continuous Infusions:   . dextrose 5 % and 0.9 % NaCl with KCl 20 mEq/L 125 mL/hr at 02/10/12 1045  . DISCONTD: 0.9 % NaCl with KCl 20 mEq / L 150 mL/hr at 02/10/12 0212   PRN Meds:.acetaminophen, acetaminophen, diphenhydrAMINE, diphenhydrAMINE, HYDROcodone-acetaminophen, HYDROmorphone (DILAUDID) injection, ondansetron, promethazine  Ht: 5\' 10"  (177.8 cm)  Wt: 174 lb (78.926 kg)  Ideal Wt: 166 lbs % Ideal Wt: 104%  Usual Wt: 180 per pt % Usual Wt: 96%  Body mass index is 24.97 kg/(m^2).  Food/Nutrition Related Hx: pt reports decreased intake, dehydration PTA  Labs:  CMP     Component Value Date/Time   NA 138 02/10/2012 0610   K 4.0 02/10/2012 0610   CL 101 02/10/2012 0610   CO2 26 02/10/2012 0610   GLUCOSE 122* 02/10/2012 0610   BUN 15 02/10/2012 0610   CREATININE 0.85 02/10/2012 0610   CALCIUM 9.1 02/10/2012 0610   PROT 8.1 02/08/2012 1001   ALBUMIN 4.1 02/08/2012 1001   AST 14 02/08/2012 1001   ALT 18 02/08/2012 1001   ALKPHOS 124* 02/08/2012 1001   BILITOT 0.8 02/08/2012 1001   GFRNONAA >90 02/10/2012 0610   GFRAA >90 02/10/2012 0610    CBC    Component Value Date/Time   WBC 17.3* 02/10/2012 0610   RBC 4.11* 02/10/2012 0610   HGB 12.9* 02/10/2012 0610   HCT 38.1* 02/10/2012 0610     PLT 266 02/10/2012 0610   MCV 92.7 02/10/2012 0610   MCH 31.4 02/10/2012 0610   MCHC 33.9 02/10/2012 0610   RDW 13.0 02/10/2012 0610   LYMPHSABS 1.1 02/08/2012 1001   MONOABS 0.9 02/08/2012 1001   EOSABS 0.0 02/08/2012 1001   BASOSABS 0.0 02/08/2012 1001    Intake: 100% clear liquids Output:   Intake/Output Summary (Last 24 hours) at 02/10/12 1712 Last data filed at 02/10/12 1413  Gross per 24 hour  Intake 3881.33 ml  Output    100 ml  Net 3781.33 ml    Diet Order: Clear Liquid  Supplements/Tube Feeding: none at this time  IVF:    dextrose 5 % and 0.9 % NaCl with KCl 20 mEq/L Last Rate: 125 mL/hr at 02/10/12 1045  DISCONTD: 0.9 % NaCl with KCl 20 mEq / L Last Rate: 150 mL/hr at 02/10/12 0212    Estimated Nutritional Needs:   Kcal: 1610-9604 Protein: 78-90g Fluid: >2.2 L/day  Pt states this is his first flare of diverticulitis since January.  He reports he is very picky about what he eats and cannot figure out what he ate that has caused his to have this flare. RD reviewed diet with pt and found pt is very strict about following recommendations for diverticulosis.  Pt avoids all seeds, nuts, and  undigestible foods.  He eliminates food with soft seeds- cucumbers, tomatoes, etc..  Pt states he has been trying to increase his fiber to 35g/day, but this has been difficult since he eliminated some seed-containing vegetables.  He also cut back on eating as much carbs which can be a good source of fiber.  Pt states that approximately 2 weeks ago, he and his wife went out of town and were so busy they did not get to eat a lot.  He wonders if this change in diet may have caused his flare.  Pt consumed 100% of clear liquid tray.  Review of wt shows pt with some wt loss, likely due to dehydration.  Will continue to monitor wt trends.  NUTRITION DIAGNOSIS: -Inadequate oral intake (NI-2.1) r/t omission of energy dense foods AEB pt with bowel rest for diverticulitis.  Status:  Ongoing  MONITORING/EVALUATION(Goals): 1.  Food/Beverage; diet advancement with tolerance 2.  Wt/wt change; monitor trends 3.  Knowledge; questions related to diverticulosis  EDUCATION NEEDS: -Education needs addressed  INTERVENTION: 1.  Brief education; provided to pt and wife. Discussed current management and how to resume nutrition therapy for diverticulosis after discharge.  No handouts provided as pt reports adequate resources at home.  Pt asks about inpatient videos.  Dietitian #: [phone]  DOCUMENTATION CODES Per approved criteria  -Not Applicable    Loyce Dys [facility] 02/10/2012, 5:10 PM

## 2012-02-11 LAB — CBC
Hemoglobin: 12.3 g/dL — ABNORMAL LOW (ref 13.0–17.0)
MCH: 31 pg (ref 26.0–34.0)
RBC: 3.97 MIL/uL — ABNORMAL LOW (ref 4.22–5.81)
WBC: 11.5 10*3/uL — ABNORMAL HIGH (ref 4.0–10.5)

## 2012-02-11 NOTE — Progress Notes (Signed)
Patient seen and examined.  Clinically improving.  Has a small supraumbilical bulge which may be a hernia but is asx.

## 2012-02-11 NOTE — Progress Notes (Signed)
  Subjective: He feels better, says he's bloated, having pain, still, better with pain medicine, He also had a new "knot," come up in his midline above the umbilicus yesterday.    Objective: Vital signs in last 24 hours: Temp:  [97.6 F (36.4 C)-98.3 F (36.8 C)] 97.6 F (36.4 C) (09/17 0526) Pulse Rate:  [71-81] 71  (09/17 0526) Resp:  [18-20] 20  (09/17 0526) BP: (133-139)/(90-96) 139/96 mmHg (09/17 0526) SpO2:  [95 %-100 %] 95 % (09/17 0526) Last BM Date: 02/10/12 Diet: clear liquids, BP up some, afebrile, WBC slowly improving Intake/Output from previous day: 09/16 0701 - 09/17 0700 In: 2908.8 [P.O.:240; I.V.:1868.8; IV Piggyback:800] Out: 100 [Urine:100] Intake/Output this shift:    General appearance: alert, cooperative and no distress Resp: clear to auscultation bilaterally GI: soft, he has what feels like a ventral hernia midline above umbilicus.  about 2 cm in diam. Tender in midline below umbilicus which is where he was most tender on Saturday on admission.  Lab Results:   Basename 02/11/12 0520 02/10/12 0610  WBC 11.5* 17.3*  HGB 12.3* 12.9*  HCT 36.3* 38.1*  PLT 279 266    BMET  Basename 02/10/12 0610 02/09/12 0420  NA 138 137  K 4.0 3.9  CL 101 99  CO2 26 23  GLUCOSE 122* 88  BUN 15 14  CREATININE 0.85 0.85  CALCIUM 9.1 9.6   PT/INR No results found for this basename: LABPROT:2,INR:2 in the last 72 hours   Lab 02/08/12 1001  AST 14  ALT 18  ALKPHOS 124*  BILITOT 0.8  PROT 8.1  ALBUMIN 4.1     Lipase     Component Value Date/Time   LIPASE 13 02/08/2012 1001     Studies/Results: No results found.  Medications:    . ciprofloxacin  400 mg Intravenous Q12H  . enoxaparin  40 mg Subcutaneous Q24H  . famotidine (PEPCID) IV  20 mg Intravenous Q12H  . metronidazole  500 mg Intravenous Q8H    Assessment/Plan *Diverticulitis with microperforation-clinically improving on IV abxs.  Active Problems:  Tobacco use  Hx of gastric ulcer    Diverticulosis   PLan:  Leave him on current diet, and antibiotics.   CT was 9/14, will most likely repeat CT 9/19 or9/20.  LOS: 3 days    Matas Burrows 02/11/2012

## 2012-02-12 MED ORDER — KCL IN DEXTROSE-NACL 20-5-0.9 MEQ/L-%-% IV SOLN
INTRAVENOUS | Status: DC
  Administered 2012-02-12: 21:00:00 via INTRAVENOUS
  Administered 2012-02-13: 1000 mL via INTRAVENOUS
  Administered 2012-02-13 – 2012-02-14 (×2): via INTRAVENOUS
  Filled 2012-02-12 (×8): qty 1000

## 2012-02-12 MED ORDER — SIMETHICONE 80 MG PO CHEW
80.0000 mg | CHEWABLE_TABLET | Freq: Once | ORAL | Status: AC
  Administered 2012-02-12: 80 mg via ORAL
  Filled 2012-02-12: qty 1

## 2012-02-12 MED ORDER — MORPHINE SULFATE 2 MG/ML IJ SOLN: 2.0000 mg | INTRAMUSCULAR | Status: DC | PRN

## 2012-02-12 MED ORDER — CIPROFLOXACIN HCL 500 MG PO TABS
500.0000 mg | ORAL_TABLET | Freq: Two times a day (BID) | ORAL | Status: DC
  Filled 2012-02-12 (×2): qty 1

## 2012-02-12 MED ORDER — CIPROFLOXACIN IN D5W 400 MG/200ML IV SOLN
400.0000 mg | Freq: Two times a day (BID) | INTRAVENOUS | Status: DC
  Administered 2012-02-13 – 2012-02-14 (×3): 400 mg via INTRAVENOUS
  Filled 2012-02-12 (×3): qty 200

## 2012-02-12 MED ORDER — METRONIDAZOLE 500 MG PO TABS
500.0000 mg | ORAL_TABLET | Freq: Three times a day (TID) | ORAL | Status: DC
  Administered 2012-02-12: 500 mg via ORAL
  Filled 2012-02-12 (×3): qty 1

## 2012-02-12 MED ORDER — METRONIDAZOLE IN NACL 5-0.79 MG/ML-% IV SOLN
500.0000 mg | Freq: Three times a day (TID) | INTRAVENOUS | Status: DC
Start: 2012-02-12 — End: 2012-02-14
  Administered 2012-02-13 – 2012-02-14 (×5): 500 mg via INTRAVENOUS
  Filled 2012-02-12 (×5): qty 100

## 2012-02-12 MED ORDER — ALUM & MAG HYDROXIDE-SIMETH 200-200-20 MG/5ML PO SUSP
15.0000 mL | Freq: Four times a day (QID) | ORAL | Status: DC | PRN
  Administered 2012-02-12: 15 mL via ORAL
  Filled 2012-02-12: qty 30

## 2012-02-12 NOTE — Progress Notes (Signed)
Patient ID: Philip Jimenez, male   DOB: [birth year], 51 y.o.   MRN: [id number]    Subjective: He feels better, having small BM, +flatus. Tender around umbilicus.  Objective: Vital signs in last 24 hours: Temp:  [96.8 F (36 C)-98.6 F (37 C)] 98.6 F (37 C) (09/18 0617) Pulse Rate:  [69-70] 70  (09/18 0617) Resp:  [17-18] 18  (09/18 0617) BP: (122-143)/(77-86) 143/77 mmHg (09/18 0617) SpO2:  [96 %-99 %] 98 % (09/18 0617) Last BM Date: 02/10/12 Diet: clear liquids, BP up some, afebrile, WBC slowly improving Intake/Output from previous day: 09/17 0701 - 09/18 0700 In: 4075.8 [P.O.:480; I.V.:3095.8; IV Piggyback:500] Out: -  Intake/Output this shift:    General appearance: A/A/O no distress Chest: CTA bilaterally Cardiac: RRR Abdomen: non distended, small ventral hernia (umbilicus) no bowel felt. Tender to palpation in this area but no more than previous exams per patient.  VSS, Labs stable, afebrile, WBC improving.  Lab Results:   Basename 02/11/12 0520 02/10/12 0610  WBC 11.5* 17.3*  HGB 12.3* 12.9*  HCT 36.3* 38.1*  PLT 279 266    BMET  Basename 02/10/12 0610  NA 138  K 4.0  CL 101  CO2 26  GLUCOSE 122*  BUN 15  CREATININE 0.85  CALCIUM 9.1   PT/INR No results found for this basename: LABPROT:2,INR:2 in the last 72 hours   Lab 02/08/12 1001  AST 14  ALT 18  ALKPHOS 124*  BILITOT 0.8  PROT 8.1  ALBUMIN 4.1     Lipase     Component Value Date/Time   LIPASE 13 02/08/2012 1001     Studies/Results: No results found.  Medications:    . ciprofloxacin  400 mg Intravenous Q12H  . enoxaparin  40 mg Subcutaneous Q24H  . famotidine (PEPCID) IV  20 mg Intravenous Q12H  . metronidazole  500 mg Intravenous Q8H    Assessment/Plan *Diverticulitis with microperforation-clinically improving on IV abxs.  Active Problems:  Tobacco use  Hx of gastric ulcer  Diverticulosis   PLan:  Continue current diet, and antibiotics. Will CT abdomen in  am. Encourage ambulation DC'd  IVF (saline locked IV) Transition to po abx    .  LOS: 4 days    Jeptha Hinnenkamp 02/12/2012

## 2012-02-12 NOTE — Progress Notes (Signed)
Patient seen and examined.  Agree with PA's note.  

## 2012-02-13 ENCOUNTER — Inpatient Hospital Stay (HOSPITAL_COMMUNITY): Payer: 59

## 2012-02-13 LAB — BASIC METABOLIC PANEL
CO2: 30 mEq/L (ref 19–32)
Calcium: 8.6 mg/dL (ref 8.4–10.5)
Creatinine, Ser: 0.83 mg/dL (ref 0.50–1.35)
GFR calc Af Amer: >90 mL/min (ref >90)
GFR calc non Af Amer: >90 mL/min (ref >90)

## 2012-02-13 LAB — CBC
MCH: 31 pg (ref 26.0–34.0)
MCHC: 34.1 g/dL (ref 30.0–36.0)
MCV: 91.1 fL (ref 78.0–100.0)
Platelets: 330 10*3/uL (ref 150–400)
RDW: 12.9 % (ref 11.5–15.5)

## 2012-02-13 MED ORDER — HYDROCORTISONE 1 % EX CREA
TOPICAL_CREAM | Freq: Two times a day (BID) | CUTANEOUS | Status: DC
  Administered 2012-02-13 – 2012-02-14 (×2): via TOPICAL
  Filled 2012-02-13: qty 28

## 2012-02-13 MED ORDER — IOHEXOL 300 MG/ML  SOLN
100.0000 mL | Freq: Once | INTRAMUSCULAR | Status: AC | PRN
  Administered 2012-02-13: 100 mL via INTRAVENOUS

## 2012-02-13 MED ORDER — IOHEXOL 300 MG/ML  SOLN
20.0000 mL | INTRAMUSCULAR | Status: AC
  Administered 2012-02-13: 20 mL via ORAL

## 2012-02-13 MED ORDER — BIOTENE DRY MOUTH MT LIQD
15.0000 mL | Freq: Two times a day (BID) | OROMUCOSAL | Status: DC
  Administered 2012-02-13 – 2012-02-14 (×2): 15 mL via OROMUCOSAL

## 2012-02-13 NOTE — Progress Notes (Signed)
Patient seen and examined.  I was hoping for better progress, thus, will get a repeat CT today.

## 2012-02-13 NOTE — Progress Notes (Signed)
Patient ID: ZIERE DOCKEN, male   DOB: [birth year], 51 y.o.   MRN: [id number]    Subjective: Feels better today, less bloating and pain, using Ice pack on abdomen, +BM, denies nausea/vomiting.  Objective: Vital signs in last 24 hours: Temp:  [97.9 F (36.6 C)-98.6 F (37 C)] 98.4 F (36.9 C) (09/19 0530) Pulse Rate:  [62-78] 62  (09/19 0530) Resp:  [16-18] 16  (09/19 0530) BP: (121-153)/(80-88) 135/80 mmHg (09/19 0530) SpO2:  [96 %-99 %] 96 % (09/19 0530) Last BM Date: 02/11/12 Diet: clear liquids, BP up some, afebrile, WBC slowly improving Intake/Output from previous day: 09/18 0701 - 09/19 0700 In: 1974.6 [P.O.:360; I.V.:1564.6; IV Piggyback:50] Out: 400 [Urine:400] Intake/Output this shift:   Physical Exam: General: awake, alert, NAD Lungs: CTA bilaterally Heart: RRR Abdomen: minimally tender over mid/lower abd with mass in area, rest of abd soft nontender.    Lab Results:   Basename 02/13/12 0520 02/11/12 0520  WBC 10.5 11.5*  HGB 12.2* 12.3*  HCT 35.8* 36.3*  PLT 330 279    BMET  Basename 02/13/12 0520  NA 138  K 3.4*  CL 103  CO2 30  GLUCOSE 121*  BUN 6  CREATININE 0.83  CALCIUM 8.6   PT/INR No results found for this basename: LABPROT:2,INR:2 in the last 72 hours   Lab 02/08/12 1001  AST 14  ALT 18  ALKPHOS 124*  BILITOT 0.8  PROT 8.1  ALBUMIN 4.1     Lipase     Component Value Date/Time   LIPASE 13 02/08/2012 1001     Studies/Results: No results found.  Medications:    . ciprofloxacin  400 mg Intravenous Q12H  . enoxaparin  40 mg Subcutaneous Q24H  . famotidine (PEPCID) IV  20 mg Intravenous Q12H  . metronidazole  500 mg Intravenous Q8H  . simethicone  80 mg Oral Once  . DISCONTD: ciprofloxacin  400 mg Intravenous Q12H  . DISCONTD: ciprofloxacin  500 mg Oral BID  . DISCONTD: metronidazole  500 mg Intravenous Q8H  . DISCONTD: metroNIDAZOLE  500 mg Oral Q8H    Assessment/Plan *Diverticulitis with microperforation-clinically  improving on IV abxs, WBC normalized  Active Problems:  Tobacco use  Hx of gastric ulcer  Diverticulosis  PLan:   --Leave at ice chips only today, advance diet tomorrow if cont to do better  --Hold on CT since improving today  --Encourage ambulation  --Cont cipro/flagyl IV today  --Transition to po abx tomorrow    .  LOS: 5 days    WHITE, ELIZABETH 02/13/2012

## 2012-02-14 ENCOUNTER — Encounter (HOSPITAL_COMMUNITY): Admission: EM | Disposition: A | Discharge status: Home Health | Payer: Self-pay | Source: Home / Self Care

## 2012-02-14 ENCOUNTER — Encounter (HOSPITAL_COMMUNITY): Payer: Self-pay | Admitting: Certified Registered"

## 2012-02-14 ENCOUNTER — Inpatient Hospital Stay (HOSPITAL_COMMUNITY): Payer: 59 | Admitting: Certified Registered"

## 2012-02-14 HISTORY — PX: COLON RESECTION: SHX5231

## 2012-02-14 LAB — PROTIME-INR
INR: 1.4 (ref 0.00–1.49)
Prothrombin Time: 16.8 seconds — ABNORMAL HIGH (ref 11.6–15.2)

## 2012-02-14 LAB — CBC
HCT: 37.1 % — ABNORMAL LOW (ref 39.0–52.0)
Hemoglobin: 12.6 g/dL — ABNORMAL LOW (ref 13.0–17.0)
MCH: 31 pg (ref 26.0–34.0)
MCHC: 34 g/dL (ref 30.0–36.0)
MCV: 91.2 fL (ref 78.0–100.0)
Platelets: 356 10*3/uL (ref 150–400)
RBC: 4.07 MIL/uL — ABNORMAL LOW (ref 4.22–5.81)
RDW: 12.8 % (ref 11.5–15.5)
WBC: 13.1 10*3/uL — ABNORMAL HIGH (ref 4.0–10.5)

## 2012-02-14 LAB — APTT: aPTT: 33 seconds (ref 24–37)

## 2012-02-14 LAB — MRSA PCR SCREENING: MRSA by PCR: NEGATIVE

## 2012-02-14 SURGERY — COLON RESECTION
Anesthesia: General | Site: Abdomen | Wound class: Dirty or Infected

## 2012-02-14 MED ORDER — PROPOFOL 10 MG/ML IV BOLUS
INTRAVENOUS | Status: DC | PRN
  Administered 2012-02-14: 200 mg via INTRAVENOUS

## 2012-02-14 MED ORDER — ENOXAPARIN SODIUM 40 MG/0.4ML ~~LOC~~ SOLN
40.0000 mg | SUBCUTANEOUS | Status: DC
  Administered 2012-02-15 – 2012-02-20 (×6): 40 mg via SUBCUTANEOUS
  Filled 2012-02-14 (×7): qty 0.4

## 2012-02-14 MED ORDER — SODIUM CHLORIDE 0.9 % IV SOLN
1.0000 g | INTRAVENOUS | Status: DC
  Administered 2012-02-15 – 2012-02-20 (×6): 1 g via INTRAVENOUS
  Filled 2012-02-14 (×7): qty 1

## 2012-02-14 MED ORDER — MORPHINE SULFATE (PF) 1 MG/ML IV SOLN
INTRAVENOUS | Status: DC
  Administered 2012-02-14: 10.5 mg via INTRAVENOUS
  Administered 2012-02-14: 19:00:00 via INTRAVENOUS
  Filled 2012-02-14: qty 25

## 2012-02-14 MED ORDER — PANTOPRAZOLE SODIUM 40 MG IV SOLR
40.0000 mg | INTRAVENOUS | Status: DC
  Administered 2012-02-14 – 2012-02-20 (×7): 40 mg via INTRAVENOUS
  Filled 2012-02-14 (×8): qty 40

## 2012-02-14 MED ORDER — SODIUM CHLORIDE 0.9 % IJ SOLN: 9.0000 mL | INTRAMUSCULAR | Status: DC | PRN

## 2012-02-14 MED ORDER — ONDANSETRON HCL 4 MG/2ML IJ SOLN: 4.0000 mg | Freq: Four times a day (QID) | INTRAMUSCULAR | Status: DC | PRN

## 2012-02-14 MED ORDER — HYDROMORPHONE 0.3 MG/ML IV SOLN
INTRAVENOUS | Status: DC
  Administered 2012-02-14: 22:00:00 via INTRAVENOUS
  Administered 2012-02-15: 4.5 mg via INTRAVENOUS
  Administered 2012-02-15: 1.2 mg via INTRAVENOUS
  Administered 2012-02-15 (×2): via INTRAVENOUS
  Administered 2012-02-15: 6.46 mg via INTRAVENOUS
  Administered 2012-02-15: 3.3 mg via INTRAVENOUS
  Administered 2012-02-15 – 2012-02-16 (×2): via INTRAVENOUS
  Administered 2012-02-16: 2.9 mg via INTRAVENOUS
  Administered 2012-02-16 (×2): 1.5 mg via INTRAVENOUS
  Administered 2012-02-16: 1.2 mg via INTRAVENOUS
  Administered 2012-02-16: 14:00:00 via INTRAVENOUS
  Administered 2012-02-16: 3.6 mg via INTRAVENOUS
  Administered 2012-02-17 (×2): 1.5 mg via INTRAVENOUS
  Administered 2012-02-17 (×2): 0.6 mg via INTRAVENOUS
  Administered 2012-02-17: 0.3 mg via INTRAVENOUS
  Filled 2012-02-14 (×6): qty 25

## 2012-02-14 MED ORDER — NEOSTIGMINE METHYLSULFATE 1 MG/ML IJ SOLN
INTRAMUSCULAR | Status: DC | PRN
  Administered 2012-02-14: 5 mg via INTRAVENOUS

## 2012-02-14 MED ORDER — DIPHENHYDRAMINE HCL 12.5 MG/5ML PO ELIX
12.5000 mg | ORAL_SOLUTION | Freq: Four times a day (QID) | ORAL | Status: DC | PRN
  Filled 2012-02-14: qty 5

## 2012-02-14 MED ORDER — ONDANSETRON HCL 4 MG/2ML IJ SOLN
4.0000 mg | Freq: Four times a day (QID) | INTRAMUSCULAR | Status: DC | PRN
  Administered 2012-02-15 – 2012-02-20 (×3): 4 mg via INTRAVENOUS
  Filled 2012-02-14 (×3): qty 2

## 2012-02-14 MED ORDER — KCL IN DEXTROSE-NACL 20-5-0.9 MEQ/L-%-% IV SOLN
INTRAVENOUS | Status: DC
  Administered 2012-02-14 – 2012-02-21 (×16): via INTRAVENOUS
  Filled 2012-02-14 (×22): qty 1000

## 2012-02-14 MED ORDER — ESMOLOL HCL 10 MG/ML IV SOLN
INTRAVENOUS | Status: DC | PRN
  Administered 2012-02-14: 30 mg via INTRAVENOUS

## 2012-02-14 MED ORDER — GLYCOPYRROLATE 0.2 MG/ML IJ SOLN
INTRAMUSCULAR | Status: DC | PRN
  Administered 2012-02-14: .8 mg via INTRAVENOUS

## 2012-02-14 MED ORDER — LIDOCAINE HCL (CARDIAC) 20 MG/ML IV SOLN
INTRAVENOUS | Status: DC | PRN
  Administered 2012-02-14: 100 mg via INTRAVENOUS
  Administered 2012-02-14: 40 mg via INTRAVENOUS

## 2012-02-14 MED ORDER — HYDROMORPHONE HCL PF 1 MG/ML IJ SOLN
0.2500 mg | INTRAMUSCULAR | Status: DC | PRN
  Administered 2012-02-14 (×4): 0.5 mg via INTRAVENOUS

## 2012-02-14 MED ORDER — 0.9 % SODIUM CHLORIDE (POUR BTL) OPTIME
TOPICAL | Status: DC | PRN
  Administered 2012-02-14: 4000 mL

## 2012-02-14 MED ORDER — SODIUM CHLORIDE 0.9 % IV SOLN
1.0000 g | INTRAVENOUS | Status: DC
  Administered 2012-02-14: 1 g via INTRAVENOUS
  Filled 2012-02-14: qty 1

## 2012-02-14 MED ORDER — LACTATED RINGERS IV SOLN
INTRAVENOUS | Status: DC | PRN
  Administered 2012-02-14 (×3): via INTRAVENOUS

## 2012-02-14 MED ORDER — MORPHINE SULFATE (PF) 1 MG/ML IV SOLN
INTRAVENOUS | Status: AC
  Filled 2012-02-14: qty 25

## 2012-02-14 MED ORDER — SUCCINYLCHOLINE CHLORIDE 20 MG/ML IJ SOLN
INTRAMUSCULAR | Status: DC | PRN
  Administered 2012-02-14: 120 mg via INTRAVENOUS

## 2012-02-14 MED ORDER — ROCURONIUM BROMIDE 100 MG/10ML IV SOLN
INTRAVENOUS | Status: DC | PRN
  Administered 2012-02-14: 50 mg via INTRAVENOUS
  Administered 2012-02-14: 10 mg via INTRAVENOUS

## 2012-02-14 MED ORDER — NALOXONE HCL 0.4 MG/ML IJ SOLN: 0.4000 mg | INTRAMUSCULAR | Status: DC | PRN

## 2012-02-14 MED ORDER — SODIUM CHLORIDE 0.9 % IV SOLN
1.0000 g | INTRAVENOUS | Status: DC
  Filled 2012-02-14: qty 1

## 2012-02-14 MED ORDER — HYDROMORPHONE HCL PF 1 MG/ML IJ SOLN
INTRAMUSCULAR | Status: AC
  Filled 2012-02-14: qty 1

## 2012-02-14 MED ORDER — DIPHENHYDRAMINE HCL 50 MG/ML IJ SOLN: 12.5000 mg | Freq: Four times a day (QID) | INTRAMUSCULAR | Status: DC | PRN

## 2012-02-14 MED ORDER — ONDANSETRON HCL 4 MG PO TABS: 4.0000 mg | ORAL_TABLET | Freq: Four times a day (QID) | ORAL | Status: DC | PRN

## 2012-02-14 MED ORDER — FENTANYL CITRATE 0.05 MG/ML IJ SOLN
INTRAMUSCULAR | Status: DC | PRN
  Administered 2012-02-14: 100 ug via INTRAVENOUS
  Administered 2012-02-14: 150 ug via INTRAVENOUS
  Administered 2012-02-14 (×2): 100 ug via INTRAVENOUS
  Administered 2012-02-14: 50 ug via INTRAVENOUS

## 2012-02-14 MED ORDER — ONDANSETRON HCL 4 MG/2ML IJ SOLN
INTRAMUSCULAR | Status: DC | PRN
  Administered 2012-02-14: 4 mg via INTRAVENOUS

## 2012-02-14 MED ORDER — MIDAZOLAM HCL 5 MG/5ML IJ SOLN
INTRAMUSCULAR | Status: DC | PRN
  Administered 2012-02-14: 2 mg via INTRAVENOUS

## 2012-02-14 SURGICAL SUPPLY — 45 items
BLADE SURG ROTATE 9660 (MISCELLANEOUS) ×1 IMPLANT
BRR ADH 5X3 SEPRAFILM 6 SHT (MISCELLANEOUS) ×1
CANISTER SUCTION 2500CC (MISCELLANEOUS) ×2 IMPLANT
CHLORAPREP W/TINT 26ML (MISCELLANEOUS) ×2 IMPLANT
CLOTH BEACON ORANGE TIMEOUT ST (SAFETY) ×2 IMPLANT
COVER SURGICAL LIGHT HANDLE (MISCELLANEOUS) ×2 IMPLANT
DRAPE LAPAROSCOPIC ABDOMINAL (DRAPES) ×2 IMPLANT
DRAPE PROXIMA HALF (DRAPES) IMPLANT
DRAPE WARM FLUID 44X44 (DRAPE) ×2 IMPLANT
DRSG PAD ABDOMINAL 8X10 ST (GAUZE/BANDAGES/DRESSINGS) ×1 IMPLANT
ELECT BLADE 6.5 EXT (BLADE) ×1 IMPLANT
ELECT REM PT RETURN 9FT ADLT (ELECTROSURGICAL) ×2
ELECTRODE REM PT RTRN 9FT ADLT (ELECTROSURGICAL) ×1 IMPLANT
GLOVE BIOGEL M STRL SZ7.5 (GLOVE) ×1 IMPLANT
GLOVE BIOGEL PI IND STRL 8 (GLOVE) ×1 IMPLANT
GLOVE BIOGEL PI INDICATOR 8 (GLOVE) ×2
GLOVE ECLIPSE 8.0 STRL XLNG CF (GLOVE) ×4 IMPLANT
GOWN PREVENTION PLUS XXLARGE (GOWN DISPOSABLE) ×1 IMPLANT
GOWN STRL NON-REIN LRG LVL3 (GOWN DISPOSABLE) ×6 IMPLANT
KIT BASIN OR (CUSTOM PROCEDURE TRAY) ×2 IMPLANT
KIT ROOM TURNOVER OR (KITS) ×2 IMPLANT
LIGASURE IMPACT 36 18CM CVD LR (INSTRUMENTS) ×1 IMPLANT
NS IRRIG 1000ML POUR BTL (IV SOLUTION) ×4 IMPLANT
PACK GENERAL/GYN (CUSTOM PROCEDURE TRAY) ×2 IMPLANT
PAD ARMBOARD 7.5X6 YLW CONV (MISCELLANEOUS) ×2 IMPLANT
RELOAD PROXIMATE 75MM BLUE (ENDOMECHANICALS) ×2 IMPLANT
RELOAD STAPLE 75 3.8 BLU REG (ENDOMECHANICALS) IMPLANT
SEPRAFILM PROCEDURAL PACK 3X5 (MISCELLANEOUS) ×1 IMPLANT
SPECIMEN JAR X LARGE (MISCELLANEOUS) ×2 IMPLANT
SPONGE GAUZE 4X4 12PLY (GAUZE/BANDAGES/DRESSINGS) ×2 IMPLANT
SPONGE LAP 18X18 X RAY DECT (DISPOSABLE) ×1 IMPLANT
STAPLER PROXIMATE 75MM BLUE (STAPLE) ×1 IMPLANT
STAPLER VISISTAT 35W (STAPLE) ×2 IMPLANT
SUCTION POOLE TIP (SUCTIONS) ×2 IMPLANT
SUT PDS AB 1 TP1 96 (SUTURE) ×4 IMPLANT
SUT PROLENE 2 0 CT2 30 (SUTURE) ×1 IMPLANT
SUT VIC AB 2-0 SH 18 (SUTURE) ×3 IMPLANT
SUT VIC AB 3-0 SH 18 (SUTURE) ×2 IMPLANT
SUT VICRYL AB 2 0 TIES (SUTURE) ×2 IMPLANT
SUT VLOC 180 0 24IN GS25 (SUTURE) ×1 IMPLANT
TOWEL OR 17X24 6PK STRL BLUE (TOWEL DISPOSABLE) ×2 IMPLANT
TOWEL OR 17X26 10 PK STRL BLUE (TOWEL DISPOSABLE) ×2 IMPLANT
TRAY FOLEY CATH 14FRSI W/METER (CATHETERS) ×2 IMPLANT
WATER STERILE IRR 1000ML POUR (IV SOLUTION) ×2 IMPLANT
YANKAUER SUCT BULB TIP NO VENT (SUCTIONS) ×2 IMPLANT

## 2012-02-14 NOTE — Anesthesia Preprocedure Evaluation (Addendum)
Anesthesia Evaluation  Patient identified by MRN, date of birth, ID band Patient awake    Reviewed: Allergy & Precautions, H&P , NPO status , Patient's Chart, lab work & pertinent test results  History of Anesthesia Complications Negative for: history of anesthetic complications  Airway Mallampati: I TM Distance: >3 FB Neck ROM: Full    Dental  (+) Teeth Intact and Dental Advisory Given   Pulmonary Current Smoker,    Pulmonary exam normal       Cardiovascular negative cardio ROS      Neuro/Psych    GI/Hepatic Neg liver ROS, GERD-  Medicated and Controlled,Diverticulitis, SBO, ongoing nausea with clear liquids   Endo/Other  negative endocrine ROS  Renal/GU negative Renal ROS     Musculoskeletal negative musculoskeletal ROS (+)   Abdominal   Peds  Hematology negative hematology ROS (+)   Anesthesia Other Findings   Reproductive/Obstetrics                           Anesthesia Physical Anesthesia Plan  ASA: II  Anesthesia Plan: General   Post-op Pain Management:    Induction: Intravenous  Airway Management Planned: Oral ETT  Additional Equipment:   Intra-op Plan:   Post-operative Plan: Extubation in OR  Informed Consent: I have reviewed the patients History and Physical, chart, labs and discussed the procedure including the risks, benefits and alternatives for the proposed anesthesia with the patient or authorized representative who has indicated his/her understanding and acceptance.     Plan Discussed with: CRNA and Surgeon  Anesthesia Plan Comments:         Anesthesia Quick Evaluation

## 2012-02-14 NOTE — Anesthesia Procedure Notes (Signed)
Procedure Name: Intubation Date/Time: 02/14/2012 3:57 PM Performed by: Margaree Mackintosh Pre-anesthesia Checklist: Patient identified, Timeout performed, Emergency Drugs available, Suction available and Patient being monitored Patient Re-evaluated:Patient Re-evaluated prior to inductionOxygen Delivery Method: Circle system utilized Preoxygenation: Pre-oxygenation with 100% oxygen Intubation Type: IV induction and Rapid sequence Laryngoscope Size: Mac and 3 Grade View: Grade I Tube type: Oral Tube size: 7.5 mm Number of attempts: 1 Airway Equipment and Method: Stylet Placement Confirmation: ETT inserted through vocal cords under direct vision,  positive ETCO2 and breath sounds checked- equal and bilateral Secured at: 21 cm Tube secured with: Tape Dental Injury: Teeth and Oropharynx as per pre-operative assessment

## 2012-02-14 NOTE — Preoperative (Signed)
Beta Blockers   Reason not to administer Beta Blockers:Not Applicable 

## 2012-02-14 NOTE — Progress Notes (Signed)
Patient ID: Philip Jimenez, male   DOB: [birth year], 51 y.o.   MRN: [id number] Request received for aspiration/possible drainage of a diverticular abscess on pt. Imaging studies were reviewed by Dr. Grace Isaac and [facility] as below. Exam: pt awake/alert; chest- CTA with sl dim BS bases; heart-RRR; abd- soft, +BS, NT.     Filed Vitals:   02/13/12 0530 02/13/12 1446 02/13/12 2130 02/14/12 0622  BP: 135/80 149/93 150/78 162/87  Pulse: 62 56 64 73  Temp: 98.4 F (36.9 C) 98.2 F (36.8 C) 98.4 F (36.9 C) 98 F (36.7 C)  TempSrc: Oral Oral Oral   Resp: 16 16 18 18   Height:      Weight:      SpO2: 96% 96% 96% 95%   Past Medical History  Diagnosis Date  . Tobacco use 02/08/2012    Less than 1ppd for 36 years  . Hx of gastric ulcer 02/08/2012  . Diverticulosis 02/08/2012    With diverticulitis DEC 2012. Colonoscopy Jan 2013 DR. Mann  . Diverticulitis with perforation 02/08/2012   History reviewed. No pertinent past surgical history. Ct Abdomen Pelvis W Contrast  02/13/2012  *RADIOLOGY REPORT*  Clinical Data: Sigmoid diverticulitis.  Abdominal pain and distension.  CT ABDOMEN AND PELVIS WITH CONTRAST  Technique:  Multidetector CT imaging of the abdomen and pelvis was performed following the standard protocol during bolus administration of intravenous contrast.  Contrast: OMNIPAQUE IOHEXOL 300 MG/ML  SOLN  Comparison: CT of the abdomen and pelvis 02/08/2012.  Findings:  Lung Bases: Compared to the prior examination there are now small bilateral pleural effusions (right greater than left) with some passive atelectasis in the lower lobes of the lungs bilaterally. Small hiatal hernia.  Abdomen/Pelvis:  The bile within the lumen of the gallbladder is slightly high attenuation, which likely represents some vicarious excretion of contrast material from recent CT of abdomen and pelvis 02/08/2012.  The appearance of the liver, pancreas, spleen and bilateral adrenal glands is unremarkable.  Multiple low attenuation  renal lesions are again noted bilaterally, largest of which is 2.5 cm in the interpolar region of the right kidney, compatible with a cyst.  Smaller renal lesions are too small to definitively characterize.  Compared to the prior examination there is now marked dilatation of the small bowel, measuring up to 4.9 cm in diameter.  This predominantly effects the mid small bowel (jejunum), and there appears to be a transition to small caliber thick-walled bowel in the region of the distal and terminal ileum.  This portion of the small bowel was immediately adjacent to inflammatory changes from the patient's sigmoid diverticulitis.  Notably, there is a large amount of oral contrast in the more proximal small bowel, however, there is also oral contrast which has traversed through the area of distal ileal narrowing and is noted throughout the colon extending to the level of the rectum.  Numerous colonic diverticula are again noted, and there continues to be some inflammatory changes around the midsigmoid colon compatible with diverticulitis.  Images 67 - 74 clearly demonstrate an extraluminal collection of gas (with a trace amount of fluid), likely to represent a contained perforation within the mesentery. This has increased in size compared to the prior study.  There is also a small volume of ascites layering dependently within the pelvis.  At this time, this does not appear to exert significant mass effect upon adjacent structures (i.e., this does not appear to represent an abscess).  Musculoskeletal: There are no aggressive appearing lytic or  blastic lesions noted in the visualized portions of the skeleton.  IMPRESSION: 1.  Sigmoid diverticulitis with an enlarging extraluminal gas and fluid collection in the adjacent small bowel mesentery, favored to represent a contained perforation (likely a developing abscess). There is also a small volume of ascites in the pelvis which, at this time, it does not appear to represents  an abscess. 2.  Inflammatory changes of the sigmoid colon appear to be affecting the adjacent distal small bowel where there is marked bowel wall thickening and narrowing of the lumen of the distal and terminal ileum.  This is associated with marked dilatation of the more proximal small bowel, however, contrast is present within the colon extending to the level of the distal rectum.  Findings may simply reflect a secondary adynamic ileus related to inflammation, however, partial small bowel obstruction is difficult to entirely exclude, and clinical correlation is recommended. 3. New small bilateral pleural effusions with dependent passive atelectasis in the lower lobes of the lungs bilaterally. 4.  Additional incidental findings, as above.   Original Report Authenticated By: Florencia Reasons, M.D.    Ct Abdomen Pelvis W Contrast  02/08/2012  *RADIOLOGY REPORT*  Clinical Data: Abdominal pain.  CT ABDOMEN AND PELVIS WITH CONTRAST  Technique:  Multidetector CT imaging of the abdomen and pelvis was performed following the standard protocol during bolus administration of intravenous contrast.  Contrast: OMNIPAQUE IOHEXOL 300 MG/ML  SOLN  Comparison: None.  Findings: Dependent subsegmental atelectasis noted in the lower lobes.  Small hiatal hernia noted.  The liver, spleen, pancreas, and adrenal glands appear unremarkable.  The gallbladder and biliary system appear unremarkable.  No pathologic retroperitoneal or porta hepatis adenopathy is identified.  Right kidney lower pole exophytic cyst measures 2.3 cm in long axis.  Right kidney upper pole scar noted.  Left kidney unremarkable.  There is prominent sigmoid diverticulitis, with several tiny locules of extraluminal gas indicative of microperforation. Prominent wall thickening of the adjacent loop of distal ileum favors prominent secondary inflammation.  The terminal most ileum does not appear inflamed.  A tiny collection of gas and fluid adjacent to this loop  is noted anteriorly on image 71 of series 2. Surrounding mesenteric and omental edema.  Urinary bladder unremarkable.    Central prostate gland calcifications noted.  Appendix normal.  Scattered colonic diverticula especially in the descending colon.  IMPRESSION: 1.  Prominent sigmoid diverticulitis with evidence of microperforation and several small extraluminal locules of gas. There is prominent secondary inflammation of the adjacent loop of distal ileum. Suspected incipient abscess formation anteriorly.  2.  Small hiatal hernia.  3.  Right kidney upper pole chronic scar.  4.  Small right kidney lower pole exophytic cyst.  5.  Mild aortoiliac atherosclerotic calcification.   Original Report Authenticated By: Dellia Cloud, M.D.   Results for orders placed during the hospital encounter of 02/08/12  CBC WITH DIFFERENTIAL      Component Value Range   WBC 24.3 (*) 4.0 - 10.5 K/uL   RBC 4.72  4.22 - 5.81 MIL/uL   Hemoglobin 15.2  13.0 - 17.0 g/dL   HCT 84.6  96.2 - 95.2 %   MCV 91.1  78.0 - 100.0 fL   MCH 32.2  26.0 - 34.0 pg   MCHC 35.3  30.0 - 36.0 g/dL   RDW 84.1  32.4 - 40.1 %   Platelets 286  150 - 400 K/uL   Neutrophils Relative 92 (*) 43 - 77 %  Neutro Abs 22.2 (*) 1.7 - 7.7 K/uL   Lymphocytes Relative 5 (*) 12 - 46 %   Lymphs Abs 1.1  0.7 - 4.0 K/uL   Monocytes Relative 4  3 - 12 %   Monocytes Absolute 0.9  0.1 - 1.0 K/uL   Eosinophils Relative 0  0 - 5 %   Eosinophils Absolute 0.0  0.0 - 0.7 K/uL   Basophils Relative 0  0 - 1 %   Basophils Absolute 0.0  0.0 - 0.1 K/uL  COMPREHENSIVE METABOLIC PANEL      Component Value Range   Sodium 137  135 - 145 mEq/L   Potassium 3.8  3.5 - 5.1 mEq/L   Chloride 97  96 - 112 mEq/L   CO2 27  19 - 32 mEq/L   Glucose, Bld 129 (*) 70 - 99 mg/dL   BUN 17  6 - 23 mg/dL   Creatinine, Ser 0.27  0.50 - 1.35 mg/dL   Calcium 25.3  8.4 - 66.4 mg/dL   Total Protein 8.1  6.0 - 8.3 g/dL   Albumin 4.1  3.5 - 5.2 g/dL   AST 14  0 - 37 U/L   ALT 18   0 - 53 U/L   Alkaline Phosphatase 124 (*) 39 - 117 U/L   Total Bilirubin 0.8  0.3 - 1.2 mg/dL   GFR calc non Af Amer >90  >90 mL/min   GFR calc Af Amer >90  >90 mL/min  LIPASE, BLOOD      Component Value Range   Lipase 13  11 - 59 U/L  URINALYSIS, ROUTINE W REFLEX MICROSCOPIC      Component Value Range   Color, Urine YELLOW  YELLOW   APPearance CLEAR  CLEAR   Specific Gravity, Urine >1.046 (*) 1.005 - 1.030   pH 6.0  5.0 - 8.0   Glucose, UA NEGATIVE  NEGATIVE mg/dL   Hgb urine dipstick SMALL (*) NEGATIVE   Bilirubin Urine SMALL (*) NEGATIVE   Ketones, ur >80 (*) NEGATIVE mg/dL   Protein, ur 30 (*) NEGATIVE mg/dL   Urobilinogen, UA 1.0  0.0 - 1.0 mg/dL   Nitrite NEGATIVE  NEGATIVE   Leukocytes, UA NEGATIVE  NEGATIVE  LACTIC ACID, PLASMA      Component Value Range   Lactic Acid, Venous 1.0  0.5 - 2.2 mmol/L  CBC      Component Value Range   WBC 19.0 (*) 4.0 - 10.5 K/uL   RBC 4.20 (*) 4.22 - 5.81 MIL/uL   Hemoglobin 13.3  13.0 - 17.0 g/dL   HCT 40.3 (*) 47.4 - 25.9 %   MCV 91.0  78.0 - 100.0 fL   MCH 31.7  26.0 - 34.0 pg   MCHC 34.8  30.0 - 36.0 g/dL   RDW 56.3  87.5 - 64.3 %   Platelets 227  150 - 400 K/uL  CREATININE, SERUM      Component Value Range   Creatinine, Ser 0.78  0.50 - 1.35 mg/dL   GFR calc non Af Amer >90  >90 mL/min   GFR calc Af Amer >90  >90 mL/min  BASIC METABOLIC PANEL      Component Value Range   Sodium 137  135 - 145 mEq/L   Potassium 3.9  3.5 - 5.1 mEq/L   Chloride 99  96 - 112 mEq/L   CO2 23  19 - 32 mEq/L   Glucose, Bld 88  70 - 99 mg/dL   BUN 14  6 -  23 mg/dL   Creatinine, Ser 9.60  0.50 - 1.35 mg/dL   Calcium 9.6  8.4 - 45.4 mg/dL   GFR calc non Af Amer >90  >90 mL/min   GFR calc Af Amer >90  >90 mL/min  CBC      Component Value Range   WBC 25.6 (*) 4.0 - 10.5 K/uL   RBC 4.61  4.22 - 5.81 MIL/uL   Hemoglobin 14.7  13.0 - 17.0 g/dL   HCT 09.8  11.9 - 14.7 %   MCV 92.6  78.0 - 100.0 fL   MCH 31.9  26.0 - 34.0 pg   MCHC 34.4  30.0 -  36.0 g/dL   RDW 82.9  56.2 - 13.0 %   Platelets 277  150 - 400 K/uL  URINE MICROSCOPIC-ADD ON      Component Value Range   RBC / HPF 0-2  <3 RBC/hpf   Bacteria, UA RARE  RARE   Urine-Other MUCOUS PRESENT    CBC      Component Value Range   WBC 17.3 (*) 4.0 - 10.5 K/uL   RBC 4.11 (*) 4.22 - 5.81 MIL/uL   Hemoglobin 12.9 (*) 13.0 - 17.0 g/dL   HCT 86.5 (*) 78.4 - 69.6 %   MCV 92.7  78.0 - 100.0 fL   MCH 31.4  26.0 - 34.0 pg   MCHC 33.9  30.0 - 36.0 g/dL   RDW 29.5  28.4 - 13.2 %   Platelets 266  150 - 400 K/uL  BASIC METABOLIC PANEL      Component Value Range   Sodium 138  135 - 145 mEq/L   Potassium 4.0  3.5 - 5.1 mEq/L   Chloride 101  96 - 112 mEq/L   CO2 26  19 - 32 mEq/L   Glucose, Bld 122 (*) 70 - 99 mg/dL   BUN 15  6 - 23 mg/dL   Creatinine, Ser 4.40  0.50 - 1.35 mg/dL   Calcium 9.1  8.4 - 10.2 mg/dL   GFR calc non Af Amer >90  >90 mL/min   GFR calc Af Amer >90  >90 mL/min  MAGNESIUM      Component Value Range   Magnesium 1.8  1.5 - 2.5 mg/dL  CBC      Component Value Range   WBC 11.5 (*) 4.0 - 10.5 K/uL   RBC 3.97 (*) 4.22 - 5.81 MIL/uL   Hemoglobin 12.3 (*) 13.0 - 17.0 g/dL   HCT 72.5 (*) 36.6 - 44.0 %   MCV 91.4  78.0 - 100.0 fL   MCH 31.0  26.0 - 34.0 pg   MCHC 33.9  30.0 - 36.0 g/dL   RDW 34.7  42.5 - 95.6 %   Platelets 279  150 - 400 K/uL  CBC      Component Value Range   WBC 10.5  4.0 - 10.5 K/uL   RBC 3.93 (*) 4.22 - 5.81 MIL/uL   Hemoglobin 12.2 (*) 13.0 - 17.0 g/dL   HCT 38.7 (*) 56.4 - 33.2 %   MCV 91.1  78.0 - 100.0 fL   MCH 31.0  26.0 - 34.0 pg   MCHC 34.1  30.0 - 36.0 g/dL   RDW 95.1  88.4 - 16.6 %   Platelets 330  150 - 400 K/uL  BASIC METABOLIC PANEL      Component Value Range   Sodium 138  135 - 145 mEq/L   Potassium 3.4 (*) 3.5 - 5.1 mEq/L  Chloride 103  96 - 112 mEq/L   CO2 30  19 - 32 mEq/L   Glucose, Bld 121 (*) 70 - 99 mg/dL   BUN 6  6 - 23 mg/dL   Creatinine, Ser 1.61  0.50 - 1.35 mg/dL   Calcium 8.6  8.4 - 09.6 mg/dL   GFR  calc non Af Amer >90  >90 mL/min   GFR calc Af Amer >90  >90 mL/min  CBC      Component Value Range   WBC 13.1 (*) 4.0 - 10.5 K/uL   RBC 4.07 (*) 4.22 - 5.81 MIL/uL   Hemoglobin 12.6 (*) 13.0 - 17.0 g/dL   HCT 04.5 (*) 40.9 - 81.1 %   MCV 91.2  78.0 - 100.0 fL   MCH 31.0  26.0 - 34.0 pg   MCHC 34.0  30.0 - 36.0 g/dL   RDW 91.4  78.2 - 95.6 %   Platelets 356  150 - 400 K/uL   A/P: Pt with diverticular abscess; plan is for CT guided aspiration/possible drainage of abscess on 9/21. Details/risks of procedure d/w pt with his understanding and consent.

## 2012-02-14 NOTE — Transfer of Care (Signed)
Immediate Anesthesia Transfer of Care Note  Patient: Philip Jimenez  Procedure(s) Performed: Procedure(s) (LRB) with comments: COLON RESECTION (N/A)  Patient Location: PACU  Anesthesia Type: General  Level of Consciousness: awake, alert , oriented and patient cooperative  Airway & Oxygen Therapy: Patient Spontanous Breathing and Patient connected to face mask oxygen  Post-op Assessment: Report given to PACU RN  Post vital signs: Reviewed and stable  Complications: No apparent anesthesia complications

## 2012-02-14 NOTE — Op Note (Signed)
Operative Note  Philip Jimenez male 51 y.o. 02/14/2012  PREOPERATIVE DX:  Perforated sigmoid diverticulitis with small bowel obstruction  POSTOPERATIVE DX:  Same  PROCEDURE:  Exploratory laparotomy, lysis of adhesions, sigmoid colectomy, colostomy         Surgeon: Adolph Pollack   Assistants: Gaynelle Adu M.D.  Anesthesia: General endotracheal anesthesia  Indications:  This is a 51 year old male admitted 6 days ago with sigmoid diverticulitis and a microperforation but more abscess. Initially he was responding to medical treatment but today became worse with respect to his clinical status and a CT scan yesterday and is slightly worsening picture of his diverticulitis and an involving small bowel obstruction. Because of his worsening status he is brought to the operating room for the above procedure.    Procedure Detail:  He was brought to the operating room placed supine on the operating table and general anesthetic was administered. A Foley catheter and NG tube were inserted. The hair on the abdominal wall was clipped. The abdominal wall was widely sterilely prepped and draped.  A midline incision was made dividing the skin, subcutaneous tissue, and fascia and entered the peritoneal cavity. The small intestine was diffusely dilated down to a point of obstruction in the distal ileum. This area was adherent to the microperforated area of the sigmoid colon. Using careful blunt dissection electrocautery freed up this adhesion and this helped relieve the obstruction. I then divided some other adhesions of the terminal ileum to mobilize this.  I mobilized the sigmoid colon and distal portion of the left colon by dividing the lateral attachments.  Then I identified the rectosigmoid junction and divided the colon here where it was soft and normal appearing. The area of diverticulitis with microperforation in duration was well proximal to this. I then divided the sigmoid colon descending colon  junction with the GIA stapler. I mark the rectal stump with two 2-0 Prolene sutures. I then divided the mesentery of the sigmoid colon close to the bowel wall with the LigaSure and also use suture ligatures to ligate the vessels. The proximal portion the specimen was marked with a suture and it was sent off to pathology.  I had adequate mobilization of the descending colon to perform a colostomy.  Following this I then milked back the small intestinal contents back into the stomach and evacuated this decompressing the small bowel. The small bowel was inspected and there is no evidence of enterotomy.  The peritoneal cavity was then copiously irrigated out with saline solution the solution evacuated as much as possible. No abscess cavities were noted.   In the left lower quadrant area a circular skin and subcutaneous tissue incision was made. The anterior fascia was identified and a cruciate incision made in it. The peritoneum and posterior fascia identified a cruciate incision made in these. This track was dilated to 2 fingerbreadths. Descending colostomy stump was then brought through this incision and the descending colon anchored to the anterior fascia with interrupted 2-0 Vicryl sutures.  Following this I placed Seprafilm over the rectal stump area and a over the omentum which I placed over the small bowel. I then closed the fascia with running double looped PDS suture and intermittent #1 Novafil sutures. I approximated the skin in the periumbilical area and left the rest of the subcutaneous tissue opened.  The colostomy was then matured using interrupted 3-0 Vicryl sutures. A colostomy appliance was applied. Open portions of the wound then packed with saline moistened gauze followed by bulky  dry dressing.  He tolerated the procedure well without any apparent complications and was taken to the recovery room in satisfactory condition.   Estimated Blood Loss:  200 mL         Drains: none  Blood  Given: none          Specimens: Sigmoid colon        Complications:  * No complications entered in OR log *         Disposition: PACU - hemodynamically stable.         Condition: stable

## 2012-02-14 NOTE — Progress Notes (Signed)
Nutrition Follow-up  Intervention:   1.  Parenteral nutrition; consider initiation of nutrition support if pt not expected to advance PO diet within 48 hrs or slow advancement anticipated.  PharmD to manage.  Assessment:   Pt remains NPO due to diverticular abscess.  Pt may require surgical intervention, however is planning for IR drain placement tomorrow (9/21). Pt has been NPO/clear liquids x6 days with suspected fair nutrition status PTA.  Diet Order: NPO  Meds: Scheduled Meds:   . antiseptic oral rinse  15 mL Mouth Rinse BID  . ertapenem  1 g Intravenous Q24H  . famotidine (PEPCID) IV  20 mg Intravenous Q12H  . hydrocortisone cream   Topical BID  . iohexol  20 mL Oral Q1 Hr x 2  . DISCONTD: ciprofloxacin  400 mg Intravenous Q12H  . DISCONTD: enoxaparin  40 mg Subcutaneous Q24H  . DISCONTD: metronidazole  500 mg Intravenous Q8H   Continuous Infusions:   . dextrose 5 % and 0.9 % NaCl with KCl 20 mEq/L 125 mL/hr at 02/14/12 0744   PRN Meds:.acetaminophen, acetaminophen, alum & mag hydroxide-simeth, diphenhydrAMINE, diphenhydrAMINE, HYDROcodone-acetaminophen, HYDROmorphone (DILAUDID) injection, iohexol, morphine injection, ondansetron, promethazine  Labs:  CMP     Component Value Date/Time   NA 138 02/13/2012 0520   K 3.4* 02/13/2012 0520   CL 103 02/13/2012 0520   CO2 30 02/13/2012 0520   GLUCOSE 121* 02/13/2012 0520   BUN 6 02/13/2012 0520   CREATININE 0.83 02/13/2012 0520   CALCIUM 8.6 02/13/2012 0520   PROT 8.1 02/08/2012 1001   ALBUMIN 4.1 02/08/2012 1001   AST 14 02/08/2012 1001   ALT 18 02/08/2012 1001   ALKPHOS 124* 02/08/2012 1001   BILITOT 0.8 02/08/2012 1001   GFRNONAA >90 02/13/2012 0520   GFRAA >90 02/13/2012 0520     Intake/Output Summary (Last 24 hours) at 02/14/12 1247 Last data filed at 02/14/12 0519  Gross per 24 hour  Intake 3578.63 ml  Output      0 ml  Net 3578.63 ml    Weight Status:  No new wt Last known wt (9/14):  174 lbs  Re-estimated needs:   1980-2210 kcal, 78-90g protein, >2.2 L/day  Nutrition Dx:  Inadequate oral intake, ongoing  Monitor:   1. Food/Beverage; diet advancement with tolerance.  Not met, pt remains NPO 2. Wt/wt change; monitor trends.  Not met, no new wt 3. Knowledge; questions related to diverticulosis.  Ongoing.     Loyce Dys, MS RD LDN Clinical Inpatient Dietitian Pager: [phone] Weekend/After hours pager: [phone]

## 2012-02-14 NOTE — Progress Notes (Signed)
Subjective: He feels better today than he did yesterday.  He is passing a lot of gas and has had multiple BMs throughout the night.   Objective: Vital signs in last 24 hours: Temp:  [98 F (36.7 C)-98.4 F (36.9 C)] 98 F (36.7 C) (09/20 0622) Pulse Rate:  [56-73] 73  (09/20 0622) Resp:  [16-18] 18  (09/20 0622) BP: (149-162)/(78-93) 162/87 mmHg (09/20 0622) SpO2:  [95 %-96 %] 95 % (09/20 0622) Last BM Date: 02/11/12  Intake/Output from previous day: 09/19 0701 - 09/20 0700 In: 3578.6 [I.V.:2778.6; IV Piggyback:800] Out: -  Intake/Output this shift:    PE: Abd-soft, nontender, active bowel sounds  Lab Results:   Basename 02/14/12 0620 02/13/12 0520  WBC 13.1* 10.5  HGB 12.6* 12.2*  HCT 37.1* 35.8*  PLT 356 330   BMET  Basename 02/13/12 0520  NA 138  K 3.4*  CL 103  CO2 30  GLUCOSE 121*  BUN 6  CREATININE 0.83  CALCIUM 8.6   PT/INR No results found for this basename: LABPROT:2,INR:2 in the last 72 hours Comprehensive Metabolic Panel:    Component Value Date/Time   NA 138 02/13/2012 0520   K 3.4* 02/13/2012 0520   CL 103 02/13/2012 0520   CO2 30 02/13/2012 0520   BUN 6 02/13/2012 0520   CREATININE 0.83 02/13/2012 0520   GLUCOSE 121* 02/13/2012 0520   CALCIUM 8.6 02/13/2012 0520   AST 14 02/08/2012 1001   ALT 18 02/08/2012 1001   ALKPHOS 124* 02/08/2012 1001   BILITOT 0.8 02/08/2012 1001   PROT 8.1 02/08/2012 1001   ALBUMIN 4.1 02/08/2012 1001     Studies/Results: Ct Abdomen Pelvis W Contrast  02/13/2012  *RADIOLOGY REPORT*  Clinical Data: Sigmoid diverticulitis.  Abdominal pain and distension.  CT ABDOMEN AND PELVIS WITH CONTRAST  Technique:  Multidetector CT imaging of the abdomen and pelvis was performed following the standard protocol during bolus administration of intravenous contrast.  Contrast: OMNIPAQUE IOHEXOL 300 MG/ML  SOLN  Comparison: CT of the abdomen and pelvis 02/08/2012.  Findings:  Lung Bases: Compared to the prior examination there are  now small bilateral pleural effusions (right greater than left) with some passive atelectasis in the lower lobes of the lungs bilaterally. Small hiatal hernia.  Abdomen/Pelvis:  The bile within the lumen of the gallbladder is slightly high attenuation, which likely represents some vicarious excretion of contrast material from recent CT of abdomen and pelvis 02/08/2012.  The appearance of the liver, pancreas, spleen and bilateral adrenal glands is unremarkable.  Multiple low attenuation renal lesions are again noted bilaterally, largest of which is 2.5 cm in the interpolar region of the right kidney, compatible with a cyst.  Smaller renal lesions are too small to definitively characterize.  Compared to the prior examination there is now marked dilatation of the small bowel, measuring up to 4.9 cm in diameter.  This predominantly effects the mid small bowel (jejunum), and there appears to be a transition to small caliber thick-walled bowel in the region of the distal and terminal ileum.  This portion of the small bowel was immediately adjacent to inflammatory changes from the patient's sigmoid diverticulitis.  Notably, there is a large amount of oral contrast in the more proximal small bowel, however, there is also oral contrast which has traversed through the area of distal ileal narrowing and is noted throughout the colon extending to the level of the rectum.  Numerous colonic diverticula are again noted, and there continues to be some  inflammatory changes around the midsigmoid colon compatible with diverticulitis.  Images 67 - 74 clearly demonstrate an extraluminal collection of gas (with a trace amount of fluid), likely to represent a contained perforation within the mesentery. This has increased in size compared to the prior study.  There is also a small volume of ascites layering dependently within the pelvis.  At this time, this does not appear to exert significant mass effect upon adjacent structures (i.e.,  this does not appear to represent an abscess).  Musculoskeletal: There are no aggressive appearing lytic or blastic lesions noted in the visualized portions of the skeleton.  IMPRESSION: 1.  Sigmoid diverticulitis with an enlarging extraluminal gas and fluid collection in the adjacent small bowel mesentery, favored to represent a contained perforation (likely a developing abscess). There is also a small volume of ascites in the pelvis which, at this time, it does not appear to represents an abscess. 2.  Inflammatory changes of the sigmoid colon appear to be affecting the adjacent distal small bowel where there is marked bowel wall thickening and narrowing of the lumen of the distal and terminal ileum.  This is associated with marked dilatation of the more proximal small bowel, however, contrast is present within the colon extending to the level of the distal rectum.  Findings may simply reflect a secondary adynamic ileus related to inflammation, however, partial small bowel obstruction is difficult to entirely exclude, and clinical correlation is recommended. 3. New small bilateral pleural effusions with dependent passive atelectasis in the lower lobes of the lungs bilaterally. 4.  Additional incidental findings, as above.   Original Report Authenticated By: Florencia Reasons, M.D.     Anti-infectives: Anti-infectives     Start     Dose/Rate Route Frequency Ordered Stop   02/12/12 2000   ciprofloxacin (CIPRO) tablet 500 mg  Status:  Discontinued        500 mg Oral 2 times daily 02/12/12 0929 02/12/12 1640   02/12/12 1645   ciprofloxacin (CIPRO) IVPB 400 mg        400 mg 200 mL/hr over 60 Minutes Intravenous Every 12 hours 02/12/12 1641     02/12/12 1645   metroNIDAZOLE (FLAGYL) IVPB 500 mg        500 mg 100 mL/hr over 60 Minutes Intravenous Every 8 hours 02/12/12 1641     02/12/12 1400   metroNIDAZOLE (FLAGYL) tablet 500 mg  Status:  Discontinued        500 mg Oral 3 times per day 02/12/12 0930  02/12/12 1640   02/09/12 1400   ciprofloxacin (CIPRO) IVPB 400 mg  Status:  Discontinued        400 mg 200 mL/hr over 60 Minutes Intravenous Every 12 hours 02/08/12 1556 02/12/12 0928   02/08/12 2200   metroNIDAZOLE (FLAGYL) IVPB 500 mg  Status:  Discontinued        500 mg 100 mL/hr over 60 Minutes Intravenous Every 8 hours 02/08/12 1556 02/12/12 0928   02/08/12 1445   ciprofloxacin (CIPRO) IVPB 400 mg        400 mg 200 mL/hr over 60 Minutes Intravenous  Once 02/08/12 1437 02/08/12 1545   02/08/12 1445   metroNIDAZOLE (FLAGYL) IVPB 500 mg        500 mg 100 mL/hr over 60 Minutes Intravenous  Once 02/08/12 1437 02/08/12 1935          Assessment Principal Problem:  *Diverticulitis with microperforation and now abscess-clinically, he feels significantly better and does not look  like he has a partial SBO; CT looks a little worse and WBC is up some. Active Problems:  Tobacco use  Hx of gastric ulcer  Diverticulosis    LOS: 6 days   Plan: I spoke with him about proceeding with a partial colectomy and colostomy, but he wants to try to avoid that if at all possible.  Therefore, will change antibiotics to IV InVanz and consult IR to put a drain in the abscess.  If this fails, then he understands he will need the operation.  Will hold his Lovenox for the procedure.   Laysa Kimmey [name] 02/14/2012

## 2012-02-14 NOTE — Anesthesia Postprocedure Evaluation (Signed)
  Anesthesia Post-op Note  Patient: [name]  Procedure(s) Performed: Procedure(s) (LRB) with comments: COLON RESECTION (N/A)  Patient Location: PACU  Anesthesia Type: General  Level of Consciousness: awake and alert   Airway and Oxygen Therapy: Patient Spontanous Breathing  Post-op Pain: mild  Post-op Assessment: Post-op Vital signs reviewed, Patient's Cardiovascular Status Stable, Respiratory Function Stable, Patent Airway, No signs of Nausea or vomiting and Pain level controlled  Post-op Vital Signs: stable  Complications: No apparent anesthesia complications

## 2012-02-14 NOTE — Progress Notes (Signed)
He has become worse from a clinical standpoint this afternoon.  Therefore, will proceed with exploratory laparotomy, partial colectomy and colostomy.  I have explained the procedure and risks of colon resection and colostomy.  Risks include but are not limited to bleeding, infection, wound problems, anesthesia, need for colostomy revision, injury to intraabominal organs (such as intestine, spleen, kidney, bladder, ureter, etc.).  He seems to understand and agrees to proceed.

## 2012-02-15 LAB — BASIC METABOLIC PANEL
BUN: 5 mg/dL — ABNORMAL LOW (ref 6–23)
CO2: 27 mEq/L (ref 19–32)
Chloride: 104 mEq/L (ref 96–112)
Creatinine, Ser: 0.69 mg/dL (ref 0.50–1.35)
GFR calc Af Amer: >90 mL/min (ref >90)
Glucose, Bld: 164 mg/dL — ABNORMAL HIGH (ref 70–99)

## 2012-02-15 LAB — CBC
HCT: 40.8 % (ref 39.0–52.0)
MCH: 31.1 pg (ref 26.0–34.0)
MCHC: 33.6 g/dL (ref 30.0–36.0)
MCV: 92.7 fL (ref 78.0–100.0)
RDW: 13.4 % (ref 11.5–15.5)

## 2012-02-15 MED ORDER — PROCHLORPERAZINE EDISYLATE 5 MG/ML IJ SOLN
10.0000 mg | Freq: Four times a day (QID) | INTRAMUSCULAR | Status: DC | PRN
  Administered 2012-02-15 (×2): 5 mg via INTRAVENOUS
  Filled 2012-02-15: qty 2

## 2012-02-15 MED ORDER — CHLORHEXIDINE GLUCONATE 0.12 % MT SOLN
OROMUCOSAL | Status: AC
  Administered 2012-02-15: 30 mL
  Filled 2012-02-15: qty 15

## 2012-02-15 NOTE — Progress Notes (Signed)
1 Day Post-Op  Subjective: Alert. Says he feels better. Pain control better than preop. Doesn't feel as bloated. As in G. And Foley, but tolerating.  Urine clear, good output. Vital signs stable.hemoglobin 13.7, WBC 14.8, potassium 4.5, BUN 5, glucose 164     Objective: Vital signs in last 24 hours: Temp:  [97.2 F (36.2 C)-98.1 F (36.7 C)] 97.9 F (36.6 C) (09/21 0553) Pulse Rate:  [76-118] 76  (09/21 0553) Resp:  [12-28] 14  (09/21 0553) BP: (104-167)/(68-107) 117/68 mmHg (09/21 0553) SpO2:  [93 %-100 %] 100 % (09/21 0553) Last BM Date: 02/14/12 (pre-op)  Intake/Output from previous day: 09/20 0701 - 09/21 0700 In: 5772.4 [I.V.:5552.4; NG/GT:20; IV Piggyback:200] Out: 2630 [Urine:1850; Emesis/NG output:180] Intake/Output this shift:     Exam: General appearance: morbidly obese and alert. In no severe distress. Mental status normal. Skin warm and dry Resp: clear to auscultation bilaterally GI: abdomen is soft, appropriately tender, bowel sounds absent, colostomy pink and healthy, no stool output.midline wound packed open, clean, fascia intact.    Lab Results:  Results for orders placed during the hospital encounter of 02/08/12 (from the past 24 hour(s))  PROTIME-INR     Status: Abnormal   Collection Time   02/14/12 10:09 AM      Component Value Range   Prothrombin Time 16.8 (*) 11.6 - 15.2 seconds   INR 1.40  0.00 - 1.49  APTT     Status: Normal   Collection Time   02/14/12 10:09 AM      Component Value Range   aPTT 33  24 - 37 seconds  MRSA PCR SCREENING     Status: Normal   Collection Time   02/14/12  2:55 PM      Component Value Range   MRSA by PCR NEGATIVE  NEGATIVE  BASIC METABOLIC PANEL     Status: Abnormal   Collection Time   02/15/12  6:30 AM      Component Value Range   Sodium 138  135 - 145 mEq/L   Potassium 4.5  3.5 - 5.1 mEq/L   Chloride 104  96 - 112 mEq/L   CO2 27  19 - 32 mEq/L   Glucose, Bld 164 (*) 70 - 99 mg/dL   BUN 5 (*) 6 - 23 mg/dL   Creatinine, Ser 1.61  0.50 - 1.35 mg/dL   Calcium 8.4  8.4 - 09.6 mg/dL   GFR calc non Af Amer >90  >90 mL/min   GFR calc Af Amer >90  >90 mL/min  CBC     Status: Abnormal   Collection Time   02/15/12  6:30 AM      Component Value Range   WBC 14.8 (*) 4.0 - 10.5 K/uL   RBC 4.40  4.22 - 5.81 MIL/uL   Hemoglobin 13.7  13.0 - 17.0 g/dL   HCT 04.5  40.9 - 81.1 %   MCV 92.7  78.0 - 100.0 fL   MCH 31.1  26.0 - 34.0 pg   MCHC 33.6  30.0 - 36.0 g/dL   RDW 91.4  78.2 - 95.6 %   Platelets 403 (*) 150 - 400 K/uL     Studies/Results: @RISRSLT24 @     . chlorhexidine      . enoxaparin (LOVENOX) injection  40 mg Subcutaneous Q24H  . ertapenem (INVANZ) IV  1 g Intravenous Q24H  . HYDROmorphone      . HYDROmorphone PCA 0.3 mg/mL   Intravenous Q4H  . morphine      .  pantoprazole (PROTONIX) IV  40 mg Intravenous Q24H  . DISCONTD: antiseptic oral rinse  15 mL Mouth Rinse BID  . DISCONTD: ertapenem  1 g Intravenous Q24H  . DISCONTD: ertapenem (INVANZ) IV  1 g Intravenous Q24H  . DISCONTD: famotidine (PEPCID) IV  20 mg Intravenous Q12H  . DISCONTD: hydrocortisone cream   Topical BID  . DISCONTD: morphine   Intravenous Q4H     Assessment/Plan: s/p Procedure(s): COLON RESECTION COLOSTOMY  POD #1. Stable, following laparotomy, Hartmann resection, and lysis of adhesions to release SBO. Continue NG and Foley. Remove Foley tomorrow. mobilize out of bed Lovenox for DVT prophylaxis Continue Invanz. Begin wound care and dressing changes Check labs tomorrow   tobacco abuse. Encouraged frequent incentive spirometry.  Past history gastric ulcer. Continue proton pump inhibitors    LOS: 7 days    Rachna Schonberger M. Derrell Lolling, M.D., [facility] General and Minimally invasive Surgery Breast and Colorectal Surgery Office:   [phone] Pager:   [phone]  02/15/2012  . .prob

## 2012-02-15 NOTE — Progress Notes (Signed)
Cleared 21.57 mg Morphine from PCA when changing over to Dilaudid PCA

## 2012-02-16 LAB — BASIC METABOLIC PANEL
BUN: 5 mg/dL — ABNORMAL LOW (ref 6–23)
CO2: 28 mEq/L (ref 19–32)
Calcium: 8.6 mg/dL (ref 8.4–10.5)
Creatinine, Ser: 0.75 mg/dL (ref 0.50–1.35)
Glucose, Bld: 115 mg/dL — ABNORMAL HIGH (ref 70–99)

## 2012-02-16 LAB — CBC
HCT: 39.6 % (ref 39.0–52.0)
Hemoglobin: 12.9 g/dL — ABNORMAL LOW (ref 13.0–17.0)
MCH: 30.9 pg (ref 26.0–34.0)
MCV: 94.7 fL (ref 78.0–100.0)
RBC: 4.18 MIL/uL — ABNORMAL LOW (ref 4.22–5.81)

## 2012-02-16 NOTE — Progress Notes (Signed)
2 Days Post-Op  Subjective: Stable and feels reasonably well.pain control adequate. Foley removed this morning. Did not get out of bed to chair yesterday.no stool or flatus yet. NG drainage moderate, bilious  Good urine output. Afebrile. Vital signs stable.  Objective: Vital signs in last 24 hours: Temp:  [98.1 F (36.7 C)-98.3 F (36.8 C)] 98.1 F (36.7 C) (09/22 0624) Pulse Rate:  [68-92] 90  (09/22 0624) Resp:  [12-18] 18  (09/22 0624) BP: (103-148)/(54-74) 117/72 mmHg (09/22 0624) SpO2:  [93 %-100 %] 93 % (09/22 0624) Last BM Date: 02/14/12  Intake/Output from previous day: 09/21 0701 - 09/22 0700 In: 1595.8 [I.V.:1595.8] Out: 1650 [Urine:1350; Emesis/NG output:300] Intake/Output this shift:    General appearance: Alert. No distress. Mental status normal. Spirits good. All operative. Resp: clear to auscultation bilaterally. Vital capacity 2500 cc. GI: soft, appropriately tender, midline wound packed open and clean, colostomy healthy. Pain. No stool or flatus in bag.no bowel sounds yet.  Lab Results:  Results for orders placed during the hospital encounter of 02/08/12 (from the past 24 hour(s))  CBC     Status: Abnormal   Collection Time   02/16/12  5:15 AM      Component Value Range   WBC 11.3 (*) 4.0 - 10.5 K/uL   RBC 4.18 (*) 4.22 - 5.81 MIL/uL   Hemoglobin 12.9 (*) 13.0 - 17.0 g/dL   HCT 16.1  09.6 - 04.5 %   MCV 94.7  78.0 - 100.0 fL   MCH 30.9  26.0 - 34.0 pg   MCHC 32.6  30.0 - 36.0 g/dL   RDW 40.9  81.1 - 91.4 %   Platelets 400  150 - 400 K/uL  BASIC METABOLIC PANEL     Status: Abnormal   Collection Time   02/16/12  5:15 AM      Component Value Range   Sodium 136  135 - 145 mEq/L   Potassium 4.3  3.5 - 5.1 mEq/L   Chloride 101  96 - 112 mEq/L   CO2 28  19 - 32 mEq/L   Glucose, Bld 115 (*) 70 - 99 mg/dL   BUN 5 (*) 6 - 23 mg/dL   Creatinine, Ser 7.82  0.50 - 1.35 mg/dL   Calcium 8.6  8.4 - 95.6 mg/dL   GFR calc non Af Amer >90  >90 mL/min   GFR calc Af  Amer >90  >90 mL/min     Studies/Results: @RISRSLT24 @     . chlorhexidine      . enoxaparin (LOVENOX) injection  40 mg Subcutaneous Q24H  . ertapenem (INVANZ) IV  1 g Intravenous Q24H  . HYDROmorphone PCA 0.3 mg/mL   Intravenous Q4H  . pantoprazole (PROTONIX) IV  40 mg Intravenous Q24H     Assessment/Plan: s/p Procedure(s): COLON RESECTION  POD #2. Stable, following laparotomy, Hartmann resection, and lysis of adhesions to release SBO.  Still has ileus, so we'll leave NG tube in. Foley removed this morning.  Needs to mobilize, ambulate. This was encouraged and discussed with nursing staff. Lovenox for DVT prophylaxis  Continue Invanz.  BID wound care and dressing changes  Check pathology  tobacco abuse. Encouraged frequent incentive spirometry.   Past history gastric ulcer. Continue proton pump inhibitors     LOS: 8 days    Dajai Wahlert M. Derrell Lolling, M.D., [facility] General and Minimally invasive Surgery Breast and Colorectal Surgery Office:   [phone] Pager:   [phone]  02/16/2012  . .prob

## 2012-02-17 MED ORDER — LORAZEPAM 2 MG/ML IJ SOLN
0.5000 mg | Freq: Once | INTRAMUSCULAR | Status: AC
  Administered 2012-02-18: 0.5 mg via INTRAVENOUS
  Filled 2012-02-17 (×2): qty 1

## 2012-02-17 MED ORDER — CHLORHEXIDINE GLUCONATE 0.12 % MT SOLN
15.0000 mL | Freq: Two times a day (BID) | OROMUCOSAL | Status: DC
  Administered 2012-02-17 – 2012-02-20 (×7): 15 mL via OROMUCOSAL
  Filled 2012-02-17 (×9): qty 15

## 2012-02-17 MED ORDER — MORPHINE SULFATE 2 MG/ML IJ SOLN
1.0000 mg | INTRAMUSCULAR | Status: DC | PRN
  Administered 2012-02-18: 2 mg via INTRAVENOUS
  Filled 2012-02-17: qty 1

## 2012-02-17 MED ORDER — BIOTENE DRY MOUTH MT LIQD
15.0000 mL | Freq: Two times a day (BID) | OROMUCOSAL | Status: DC
  Administered 2012-02-17 – 2012-02-20 (×8): 15 mL via OROMUCOSAL

## 2012-02-17 NOTE — Progress Notes (Signed)
Patient ID: Philip Jimenez, male   DOB: December 27, 1960, 51 y.o.   MRN: [id number] 3 Days Post-Op  Subjective: Pt feels ok.  No flatus in bag yet.  Hasn't ambulated much yet.  Objective: Vital signs in last 24 hours: Temp:  [98.5 F (36.9 C)-100.4 F (38 C)] 99.3 F (37.4 C) (09/23 0533) Pulse Rate:  [24-97] 97  (09/23 0533) Resp:  [18-26] 19  (09/23 0806) BP: (125-163)/(75-103) 132/87 mmHg (09/23 0533) SpO2:  [91 %-99 %] 94 % (09/23 0806) Last BM Date: 02/14/12  Intake/Output from previous day: 09/22 0701 - 09/23 0700 In: 2418.8 [I.V.:2418.8] Out: 2900 [Urine:1850; Emesis/NG output:1050] Intake/Output this shift: Total I/O In: -  Out: 500 [Urine:500]  PE: Abd: soft, tender appropriately, ostomy with some old bloody drainage, but no stool or air yet.  Lab Results:   Basename 02/16/12 0515 02/15/12 0630  WBC 11.3* 14.8*  HGB 12.9* 13.7  HCT 39.6 40.8  PLT 400 403*   BMET  Basename 02/16/12 0515 02/15/12 0630  NA 136 138  K 4.3 4.5  CL 101 104  CO2 28 27  GLUCOSE 115* 164*  BUN 5* 5*  CREATININE 0.75 0.69  CALCIUM 8.6 8.4   PT/INR  Basename 02/14/12 1009  LABPROT 16.8*  INR 1.40   CMP     Component Value Date/Time   NA 136 02/16/2012 0515   K 4.3 02/16/2012 0515   CL 101 02/16/2012 0515   CO2 28 02/16/2012 0515   GLUCOSE 115* 02/16/2012 0515   BUN 5* 02/16/2012 0515   CREATININE 0.75 02/16/2012 0515   CALCIUM 8.6 02/16/2012 0515   PROT 8.1 02/08/2012 1001   ALBUMIN 4.1 02/08/2012 1001   AST 14 02/08/2012 1001   ALT 18 02/08/2012 1001   ALKPHOS 124* 02/08/2012 1001   BILITOT 0.8 02/08/2012 1001   GFRNONAA >90 02/16/2012 0515   GFRAA >90 02/16/2012 0515   Lipase     Component Value Date/Time   LIPASE 13 02/08/2012 1001       Studies/Results: No results found.  Anti-infectives: Anti-infectives     Start     Dose/Rate Route Frequency Ordered Stop   02/15/12 1000   ertapenem (INVANZ) 1 g in sodium chloride 0.9 % 50 mL IVPB        1 g 100 mL/hr over 30  Minutes Intravenous Every 24 hours 02/14/12 2016     02/14/12 2000   ertapenem (INVANZ) 1 g in sodium chloride 0.9 % 50 mL IVPB  Status:  Discontinued        1 g 100 mL/hr over 30 Minutes Intravenous Every 24 hours 02/14/12 1950 02/14/12 2016   02/14/12 0900   ertapenem (INVANZ) 1 g in sodium chloride 0.9 % 50 mL IVPB  Status:  Discontinued        1 g 100 mL/hr over 30 Minutes Intravenous Every 24 hours 02/14/12 0754 02/14/12 1950   02/12/12 2000   ciprofloxacin (CIPRO) tablet 500 mg  Status:  Discontinued        500 mg Oral 2 times daily 02/12/12 0929 02/12/12 1640   02/12/12 1645   ciprofloxacin (CIPRO) IVPB 400 mg  Status:  Discontinued        400 mg 200 mL/hr over 60 Minutes Intravenous Every 12 hours 02/12/12 1641 02/14/12 0754   02/12/12 1645   metroNIDAZOLE (FLAGYL) IVPB 500 mg  Status:  Discontinued        500 mg 100 mL/hr over 60 Minutes Intravenous Every 8 hours 02/12/12  1641 02/14/12 0754   02/12/12 1400   metroNIDAZOLE (FLAGYL) tablet 500 mg  Status:  Discontinued        500 mg Oral 3 times per day 02/12/12 0930 02/12/12 1640   02/09/12 1400   ciprofloxacin (CIPRO) IVPB 400 mg  Status:  Discontinued        400 mg 200 mL/hr over 60 Minutes Intravenous Every 12 hours 02/08/12 1556 02/12/12 0928   02/08/12 2200   metroNIDAZOLE (FLAGYL) IVPB 500 mg  Status:  Discontinued        500 mg 100 mL/hr over 60 Minutes Intravenous Every 8 hours 02/08/12 1556 02/12/12 0928   02/08/12 1445   ciprofloxacin (CIPRO) IVPB 400 mg        400 mg 200 mL/hr over 60 Minutes Intravenous  Once 02/08/12 1437 02/08/12 1545   02/08/12 1445   metroNIDAZOLE (FLAGYL) IVPB 500 mg        500 mg 100 mL/hr over 60 Minutes Intravenous  Once 02/08/12 1437 02/08/12 1935           Assessment/Plan  1. S/p Hartman's for diverticulitis 2. Post op ileus  Plan: 1. Await post op ileus 2. Patient needs to get up and mobilize and cont pulm toilet 3. Cont abx therapy   LOS: 9 days    Danyele Smejkal  E 02/17/2012, 8:35 AM Pager: [phone]

## 2012-02-17 NOTE — Consult Note (Signed)
WOC ostomy consult  Stoma type/location:  LLQ, end colostomy Stomal assessment/size: pink, moist, viable aprox 1 3/8" round, budded from skin Treatment options for stomal/peristomal skin: none needed Output minimal bloody drainage in pouch Ostomy pouching: 1pc karaya in place from OR, did not replace pouch today.  Limited output and pt just up to chair.  Planned pouch change with pt and caregiver tom am. Pt agreeable to this.  Pts brother also had colostomy, pt seemed well informed on stoma creation and willing to learn care.  Education provided:  Left ostomy educational booklet at the bedside.  WOC will follow along with you for ostomy teaching Dalayna Lauter [place], [place] [phone]

## 2012-02-18 ENCOUNTER — Encounter (HOSPITAL_COMMUNITY): Payer: Self-pay | Admitting: General Surgery

## 2012-02-18 MED ORDER — PHENOL 1.4 % MT LIQD
1.0000 | OROMUCOSAL | Status: DC | PRN
  Filled 2012-02-18: qty 177

## 2012-02-18 MED ORDER — MENTHOL 3 MG MT LOZG: 1.0000 | LOZENGE | OROMUCOSAL | Status: DC | PRN

## 2012-02-18 NOTE — Progress Notes (Signed)
Nutrition Follow-up  Intervention:   1.  Modify diet; per MD discretion based on pt's tolerance. 2.  Nutrition support; pt has been NPO x10 days with several days of poor PO PTA.  If pt expected to remain NPO or proceed with slow PO advancement, recommend consideration of nutrition support. If parenteral nutrition preferred, PharmD to manage.  Assessment:   4 days post-op from colectomy with ostomy.  Pt remains NPO due to post-op ileus.  States he is much more comfortable with NGT removed.  No longer receiving ice chips to decrease intake volume. Discussed with RN- requested new wt for pt.  Diet Order:  NPO  Meds: Scheduled Meds:   . antiseptic oral rinse  15 mL Mouth Rinse q12n4p  . chlorhexidine  15 mL Mouth Rinse BID  . enoxaparin (LOVENOX) injection  40 mg Subcutaneous Q24H  . ertapenem (INVANZ) IV  1 g Intravenous Q24H  . LORazepam  0.5 mg Intravenous Once  . pantoprazole (PROTONIX) IV  40 mg Intravenous Q24H  . DISCONTD: HYDROmorphone PCA 0.3 mg/mL   Intravenous Q4H   Continuous Infusions:   . dextrose 5 % and 0.9 % NaCl with KCl 20 mEq/L 125 mL/hr at 02/18/12 0604   PRN Meds:.menthol-cetylpyridinium, morphine injection, ondansetron (ZOFRAN) IV, ondansetron, phenol, prochlorperazine, DISCONTD:  HYDROmorphone (DILAUDID) injection  Labs:  CMP     Component Value Date/Time   NA 136 02/16/2012 0515   K 4.3 02/16/2012 0515   CL 101 02/16/2012 0515   CO2 28 02/16/2012 0515   GLUCOSE 115* 02/16/2012 0515   BUN 5* 02/16/2012 0515   CREATININE 0.75 02/16/2012 0515   CALCIUM 8.6 02/16/2012 0515   PROT 8.1 02/08/2012 1001   ALBUMIN 4.1 02/08/2012 1001   AST 14 02/08/2012 1001   ALT 18 02/08/2012 1001   ALKPHOS 124* 02/08/2012 1001   BILITOT 0.8 02/08/2012 1001   GFRNONAA >90 02/16/2012 0515   GFRAA >90 02/16/2012 0515     Intake/Output Summary (Last 24 hours) at 02/18/12 1218 Last data filed at 02/18/12 0900  Gross per 24 hour  Intake 2930.42 ml  Output   3550 ml  Net -619.58 ml     Weight Status:  No new wt  Re-estimated needs:  1980-2210 kcal, 78-90g protein, >2.2 L/day  Nutrition Dx:  Inadequate oral intake, ongoing  Monitor:   1. Food/Beverage; diet advancement with tolerance. Not met, pt remains NPO  2. Wt/wt change; monitor trends. Not met, no new wt  3. Knowledge; questions related to diverticulosis. Ongoing.    Loyce Dys, MS RD LDN Clinical Inpatient Dietitian Pager: [phone] Weekend/After hours pager: [phone]

## 2012-02-18 NOTE — Consult Note (Signed)
WOC ostomy consult  Stoma type/location:  LLQ, end colostomy Stomal assessment/size: 1 3/4" round, budded nicely from the skin, pink and moist  Peristomal assessment: intact Treatment options for stomal/peristomal skin: none needed Output  Scant bloody drainage in pouch from OR Ostomy pouching: 2pc. 2 1/4" cut at 1 3/4" round, demonstrated pouch application to pt today. Education provided:  Discussed and demonstrated basic ostomy care to pt and pouch application, diet. Demonstrated burping gas from pouch. Pt performed hands on with lock and roll closure for independence.  Has educational booklet at bedside and extra supplies.  WOC will follow along with you Brynlynn Walko Marlena Clipper, CWOCN [phone]

## 2012-02-18 NOTE — Progress Notes (Signed)
Patient ID: Philip Jimenez, male   DOB: 05-13-1961, 51 y.o.   MRN: [id number] 4 Days Post-Op  Subjective: Pt feels ok, no output from bag.  Ambulating more  Objective: Vital signs in last 24 hours: Temp:  [98 F (36.7 C)-99 F (37.2 C)] 98.6 F (37 C) (09/24 0535) Pulse Rate:  [86-98] 97  (09/24 0535) Resp:  [16-24] 18  (09/24 0535) BP: (111-140)/(80-90) 111/88 mmHg (09/24 0535) SpO2:  [94 %-98 %] 94 % (09/24 0535) Last BM Date: 02/14/12  Intake/Output from previous day: 09/23 0701 - 09/24 0700 In: 2930.4 [I.V.:2930.4] Out: 3600 [Urine:1500; Emesis/NG output:2100] Intake/Output this shift:    PE: Abd: soft, wound is clean and packed, few staples still present.  Ostomy with old bloody, mucous like drainage.  No flatus.  Stoma is pink and viable.  NG with bilious output  Lab Results:   Basename 02/16/12 0515  WBC 11.3*  HGB 12.9*  HCT 39.6  PLT 400   BMET  Basename 02/16/12 0515  NA 136  K 4.3  CL 101  CO2 28  GLUCOSE 115*  BUN 5*  CREATININE 0.75  CALCIUM 8.6   PT/INR No results found for this basename: LABPROT:2,INR:2 in the last 72 hours CMP     Component Value Date/Time   NA 136 02/16/2012 0515   K 4.3 02/16/2012 0515   CL 101 02/16/2012 0515   CO2 28 02/16/2012 0515   GLUCOSE 115* 02/16/2012 0515   BUN 5* 02/16/2012 0515   CREATININE 0.75 02/16/2012 0515   CALCIUM 8.6 02/16/2012 0515   PROT 8.1 02/08/2012 1001   ALBUMIN 4.1 02/08/2012 1001   AST 14 02/08/2012 1001   ALT 18 02/08/2012 1001   ALKPHOS 124* 02/08/2012 1001   BILITOT 0.8 02/08/2012 1001   GFRNONAA >90 02/16/2012 0515   GFRAA >90 02/16/2012 0515   Lipase     Component Value Date/Time   LIPASE 13 02/08/2012 1001       Studies/Results: No results found.  Anti-infectives: Anti-infectives     Start     Dose/Rate Route Frequency Ordered Stop   02/15/12 1000   ertapenem (INVANZ) 1 g in sodium chloride 0.9 % 50 mL IVPB        1 g 100 mL/hr over 30 Minutes Intravenous Every 24 hours 02/14/12  2016     02/14/12 2000   ertapenem (INVANZ) 1 g in sodium chloride 0.9 % 50 mL IVPB  Status:  Discontinued        1 g 100 mL/hr over 30 Minutes Intravenous Every 24 hours 02/14/12 1950 02/14/12 2016   02/14/12 0900   ertapenem (INVANZ) 1 g in sodium chloride 0.9 % 50 mL IVPB  Status:  Discontinued        1 g 100 mL/hr over 30 Minutes Intravenous Every 24 hours 02/14/12 0754 02/14/12 1950   02/12/12 2000   ciprofloxacin (CIPRO) tablet 500 mg  Status:  Discontinued        500 mg Oral 2 times daily 02/12/12 0929 02/12/12 1640   02/12/12 1645   ciprofloxacin (CIPRO) IVPB 400 mg  Status:  Discontinued        400 mg 200 mL/hr over 60 Minutes Intravenous Every 12 hours 02/12/12 1641 02/14/12 0754   02/12/12 1645   metroNIDAZOLE (FLAGYL) IVPB 500 mg  Status:  Discontinued        500 mg 100 mL/hr over 60 Minutes Intravenous Every 8 hours 02/12/12 1641 02/14/12 0754   02/12/12 1400  metroNIDAZOLE (FLAGYL) tablet 500 mg  Status:  Discontinued        500 mg Oral 3 times per day 02/12/12 0930 02/12/12 1640   02/09/12 1400   ciprofloxacin (CIPRO) IVPB 400 mg  Status:  Discontinued        400 mg 200 mL/hr over 60 Minutes Intravenous Every 12 hours 02/08/12 1556 02/12/12 0928   02/08/12 2200   metroNIDAZOLE (FLAGYL) IVPB 500 mg  Status:  Discontinued        500 mg 100 mL/hr over 60 Minutes Intravenous Every 8 hours 02/08/12 1556 02/12/12 0928   02/08/12 1445   ciprofloxacin (CIPRO) IVPB 400 mg        400 mg 200 mL/hr over 60 Minutes Intravenous  Once 02/08/12 1437 02/08/12 1545   02/08/12 1445   metroNIDAZOLE (FLAGYL) IVPB 500 mg        500 mg 100 mL/hr over 60 Minutes Intravenous  Once 02/08/12 1437 02/08/12 1935           Assessment/Plan  1. S/p Hartman's for perf tics 2. Post op ileus  Plan: 1. Await bowel function, continue NGT for now.  Limit ice as this is altering his NG output. 2. Cont pulm toilet and mobilization.   LOS: 10 days    Aadil Sur E 02/18/2012, 8:56  AM Pager: [phone]

## 2012-02-18 NOTE — Progress Notes (Signed)
Patient ID: Philip Jimenez, male   DOB: December 01, 1960, 51 y.o.   MRN: [id number] [facility] Progress Note:   4 Days Post-Op  Subjective: Mental status is clear.   Objective: Vital signs in last 24 hours: Temp:  [98 F (36.7 C)-99 F (37.2 C)] 98.6 F (37 C) (09/24 0535) Pulse Rate:  [86-98] 97  (09/24 0535) Resp:  [16-24] 18  (09/24 0535) BP: (111-140)/(80-90) 111/88 mmHg (09/24 0535) SpO2:  [94 %-98 %] 94 % (09/24 0535)  Intake/Output from previous day: 09/23 0701 - 09/24 0700 In: 2930.4 [I.V.:2930.4] Out: 3600 [Urine:1500; Emesis/NG output:2100] Intake/Output this shift:    Physical Exam: Work of breathing is normal.  NG output is a lot of ice chips and PO sham feeding.  Ostomy with some liquid output.    Lab Results:  No results found for this or any previous visit (from the past 48 hour(s)).  Radiology/Results: No results found.  Anti-infectives: Anti-infectives     Start     Dose/Rate Route Frequency Ordered Stop   02/15/12 1000   ertapenem (INVANZ) 1 g in sodium chloride 0.9 % 50 mL IVPB        1 g 100 mL/hr over 30 Minutes Intravenous Every 24 hours 02/14/12 2016     02/14/12 2000   ertapenem (INVANZ) 1 g in sodium chloride 0.9 % 50 mL IVPB  Status:  Discontinued        1 g 100 mL/hr over 30 Minutes Intravenous Every 24 hours 02/14/12 1950 02/14/12 2016   02/14/12 0900   ertapenem (INVANZ) 1 g in sodium chloride 0.9 % 50 mL IVPB  Status:  Discontinued        1 g 100 mL/hr over 30 Minutes Intravenous Every 24 hours 02/14/12 0754 02/14/12 1950   02/12/12 2000   ciprofloxacin (CIPRO) tablet 500 mg  Status:  Discontinued        500 mg Oral 2 times daily 02/12/12 0929 02/12/12 1640   02/12/12 1645   ciprofloxacin (CIPRO) IVPB 400 mg  Status:  Discontinued        400 mg 200 mL/hr over 60 Minutes Intravenous Every 12 hours 02/12/12 1641 02/14/12 0754   02/12/12 1645   metroNIDAZOLE (FLAGYL) IVPB 500 mg  Status:  Discontinued        500 mg 100 mL/hr  over 60 Minutes Intravenous Every 8 hours 02/12/12 1641 02/14/12 0754   02/12/12 1400   metroNIDAZOLE (FLAGYL) tablet 500 mg  Status:  Discontinued        500 mg Oral 3 times per day 02/12/12 0930 02/12/12 1640   02/09/12 1400   ciprofloxacin (CIPRO) IVPB 400 mg  Status:  Discontinued        400 mg 200 mL/hr over 60 Minutes Intravenous Every 12 hours 02/08/12 1556 02/12/12 0928   02/08/12 2200   metroNIDAZOLE (FLAGYL) IVPB 500 mg  Status:  Discontinued        500 mg 100 mL/hr over 60 Minutes Intravenous Every 8 hours 02/08/12 1556 02/12/12 0928   02/08/12 1445   ciprofloxacin (CIPRO) IVPB 400 mg        400 mg 200 mL/hr over 60 Minutes Intravenous  Once 02/08/12 1437 02/08/12 1545   02/08/12 1445   metroNIDAZOLE (FLAGYL) IVPB 500 mg        500 mg 100 mL/hr over 60 Minutes Intravenous  Once 02/08/12 1437 02/08/12 1935          Assessment/Plan: Problem List: Patient Active Problem List  Diagnosis  . Tobacco use  . Hx of gastric ulcer  . Diverticulosis  . Diverticulitis with perforation    Will discontinue NG and keep NPO for now 4 Days Post-Op    LOS: 10 days   Matt B. Daphine Deutscher, MD, [facility] [phone] beeper [phone]  02/18/2012 9:28 AM

## 2012-02-19 MED ORDER — LORAZEPAM 2 MG/ML IJ SOLN: 1.0000 mg | Freq: Every evening | INTRAMUSCULAR | Status: DC | PRN

## 2012-02-19 MED ORDER — LORAZEPAM 2 MG/ML IJ SOLN
0.5000 mg | Freq: Once | INTRAMUSCULAR | Status: AC
  Administered 2012-02-19: 0.5 mg via INTRAVENOUS
  Filled 2012-02-19: qty 1

## 2012-02-19 MED ORDER — DIPHENHYDRAMINE HCL 50 MG/ML IJ SOLN
12.5000 mg | Freq: Once | INTRAMUSCULAR | Status: AC
  Administered 2012-02-19: 12.5 mg via INTRAVENOUS
  Filled 2012-02-19: qty 1

## 2012-02-19 NOTE — Progress Notes (Signed)
Patient ID: Philip Jimenez, male   DOB: 08/08/1960, 51 y.o.   MRN: [id number] 5 Days Post-Op  Subjective: Pt feels well.  No real nausea.  Passing some air and liquid stool in ostomy  Objective: Vital signs in last 24 hours: Temp:  [97.6 F (36.4 C)-98.4 F (36.9 C)] 97.6 F (36.4 C) (09/25 0617) Pulse Rate:  [75-89] 76  (09/25 0617) Resp:  [18-20] 20  (09/25 0617) BP: (121-144)/(76-88) 123/78 mmHg (09/25 0617) SpO2:  [94 %-98 %] 94 % (09/25 0617) Weight:  [174 lb 9.7 oz (79.2 kg)] 174 lb 9.7 oz (79.2 kg) (09/24 0900) Last BM Date: 02/14/12  Intake/Output from previous day: 09/24 0701 - 09/25 0700 In: 2800 [I.V.:2750; IV Piggyback:50] Out: 450 [Urine:450] Intake/Output this shift:    PE: Abd: soft, wound is clean and packed.  Ostomy with some air and stool.  +BS, ND  Lab Results:  No results found for this basename: WBC:2,HGB:2,HCT:2,PLT:2 in the last 72 hours BMET No results found for this basename: NA:2,K:2,CL:2,CO2:2,GLUCOSE:2,BUN:2,CREATININE:2,CALCIUM:2 in the last 72 hours PT/INR No results found for this basename: LABPROT:2,INR:2 in the last 72 hours CMP     Component Value Date/Time   NA 136 02/16/2012 0515   K 4.3 02/16/2012 0515   CL 101 02/16/2012 0515   CO2 28 02/16/2012 0515   GLUCOSE 115* 02/16/2012 0515   BUN 5* 02/16/2012 0515   CREATININE 0.75 02/16/2012 0515   CALCIUM 8.6 02/16/2012 0515   PROT 8.1 02/08/2012 1001   ALBUMIN 4.1 02/08/2012 1001   AST 14 02/08/2012 1001   ALT 18 02/08/2012 1001   ALKPHOS 124* 02/08/2012 1001   BILITOT 0.8 02/08/2012 1001   GFRNONAA >90 02/16/2012 0515   GFRAA >90 02/16/2012 0515   Lipase     Component Value Date/Time   LIPASE 13 02/08/2012 1001       Studies/Results: No results found.  Anti-infectives: Anti-infectives     Start     Dose/Rate Route Frequency Ordered Stop   02/15/12 1000   ertapenem (INVANZ) 1 g in sodium chloride 0.9 % 50 mL IVPB        1 g 100 mL/hr over 30 Minutes Intravenous Every 24 hours  02/14/12 2016     02/14/12 2000   ertapenem (INVANZ) 1 g in sodium chloride 0.9 % 50 mL IVPB  Status:  Discontinued        1 g 100 mL/hr over 30 Minutes Intravenous Every 24 hours 02/14/12 1950 02/14/12 2016   02/14/12 0900   ertapenem (INVANZ) 1 g in sodium chloride 0.9 % 50 mL IVPB  Status:  Discontinued        1 g 100 mL/hr over 30 Minutes Intravenous Every 24 hours 02/14/12 0754 02/14/12 1950   02/12/12 2000   ciprofloxacin (CIPRO) tablet 500 mg  Status:  Discontinued        500 mg Oral 2 times daily 02/12/12 0929 02/12/12 1640   02/12/12 1645   ciprofloxacin (CIPRO) IVPB 400 mg  Status:  Discontinued        400 mg 200 mL/hr over 60 Minutes Intravenous Every 12 hours 02/12/12 1641 02/14/12 0754   02/12/12 1645   metroNIDAZOLE (FLAGYL) IVPB 500 mg  Status:  Discontinued        500 mg 100 mL/hr over 60 Minutes Intravenous Every 8 hours 02/12/12 1641 02/14/12 0754   02/12/12 1400   metroNIDAZOLE (FLAGYL) tablet 500 mg  Status:  Discontinued        500 mg  Oral 3 times per day 02/12/12 0930 02/12/12 1640   02/09/12 1400   ciprofloxacin (CIPRO) IVPB 400 mg  Status:  Discontinued        400 mg 200 mL/hr over 60 Minutes Intravenous Every 12 hours 02/08/12 1556 02/12/12 0928   02/08/12 2200   metroNIDAZOLE (FLAGYL) IVPB 500 mg  Status:  Discontinued        500 mg 100 mL/hr over 60 Minutes Intravenous Every 8 hours 02/08/12 1556 02/12/12 0928   02/08/12 1445   ciprofloxacin (CIPRO) IVPB 400 mg        400 mg 200 mL/hr over 60 Minutes Intravenous  Once 02/08/12 1437 02/08/12 1545   02/08/12 1445   metroNIDAZOLE (FLAGYL) IVPB 500 mg        500 mg 100 mL/hr over 60 Minutes Intravenous  Once 02/08/12 1437 02/08/12 1935           Assessment/Plan  1. S/p Hartman's for perf tics  Plan: 1. Ileus resolving. Will give clear liquids today 2. Cont BID dressing changes 3. Decrease IVFs   LOS: 11 days    Prince Olivier E 02/19/2012, 7:57 AM Pager: [phone]

## 2012-02-19 NOTE — Care Management Note (Signed)
  Page 2 of 2   02/19/2012     1:37:14 PM   CARE MANAGEMENT NOTE 02/19/2012  Patient:  Philip Jimenez, Philip Jimenez   Account Number:  [id number]  Date Initiated:  02/10/2012  Documentation initiated by:  Carlyle Lipa  Subjective/Objective Assessment:   diverticulitis; bowel rest     Action/Plan:   home when medically stable   Anticipated DC Date:  02/21/2012   Anticipated DC Plan:  HOME W HOME HEALTH SERVICES      DC Planning Services  CM consult      Choice offered to / List presented to:          [facility] arranged  HH-1 RN      [facility] agency  [facility]   Status of service:  In process, will continue to follow Medicare Important Message given?   (If response is "NO", the following Medicare IM given date fields will be blank) Date Medicare IM given:   Date Additional Medicare IM given:    Discharge Disposition:    Per UR Regulation:  Reviewed for med. necessity/level of care/duration of stay  If discussed at Long Length of Stay Meetings, dates discussed:    Comments:    02-19-12 Face sheet information confirmed with patient. Ronny Flurry RN BSN [phone]  02-14-12 Exploratory laparotomy, lysis of adhesions, sigmoid colectomy, colostomy on 02-14-12 , now has post op ileus , IVF, NGT , IV antibiotics , PCA , IV Protonix Wacey Zieger RN BSN [phone]    02-12-12 DX: Diverticulitis with microperforation-clinically improving on IV abxs Plan: Leave him on  clear liquid  diet, and antibiotics. CT was 9/14, will most likely repeat CT 9/19 or9/20.

## 2012-02-20 NOTE — Consult Note (Signed)
WOC ostomy follow up Stoma type/location: LLQ, end colostomy Stomal assessment/size: pink, moist, 1 3/4" round and budded from the skin Peristomal assessment: intact with some slight redness of the skin that has had tape exposure.  May need to add skin prep under tape adhesive Output liquid with some solid green emptied just prior to my visit. Per pt. Ostomy pouching: 2pc. 2 1/4" wafer and pouch Education provided:  Pt cut new wafer independently, removed old pouch and cleaned his skin, applied new wafer and attached new pouch independently.  He is very motivated for self care. Supplies at bedside and [employer] for home supply ordering provided.  He is planning to have [facility] for continued ostomy support.  WOC will follow along  Philip Jimenez Philip Jimenez, [place] [phone]

## 2012-02-20 NOTE — Progress Notes (Signed)
Patient ID: Philip Jimenez, male   DOB: Apr 29, 1961, 51 y.o.   MRN: [id number] 6 Days Post-Op  Subjective: Pt feels well today. Tolerating clear liquids.  No nausea   Objective: Vital signs in last 24 hours: Temp:  [97.4 F (36.3 C)-99 F (37.2 C)] 98.2 F (36.8 C) (09/26 0502) Pulse Rate:  [71-84] 83  (09/26 0502) Resp:  [18-20] 18  (09/26 0502) BP: (118-125)/(75-83) 118/83 mmHg (09/26 0502) SpO2:  [95 %-100 %] 100 % (09/26 0502) Last BM Date: 02/19/12  Intake/Output from previous day: 09/25 0701 - 09/26 0700 In: 2039.3 [P.O.:360; I.V.:1679.3] Out: 700 [Urine:700] Intake/Output this shift: Total I/O In: -  Out: 400 [Urine:400]  PE: Abd: soft, minimally tender, +BS, wound is clean and packed.  Ostomy with some air and liquid stool.  Lab Results:  No results found for this basename: WBC:2,HGB:2,HCT:2,PLT:2 in the last 72 hours BMET No results found for this basename: NA:2,K:2,CL:2,CO2:2,GLUCOSE:2,BUN:2,CREATININE:2,CALCIUM:2 in the last 72 hours PT/INR No results found for this basename: LABPROT:2,INR:2 in the last 72 hours CMP     Component Value Date/Time   NA 136 02/16/2012 0515   K 4.3 02/16/2012 0515   CL 101 02/16/2012 0515   CO2 28 02/16/2012 0515   GLUCOSE 115* 02/16/2012 0515   BUN 5* 02/16/2012 0515   CREATININE 0.75 02/16/2012 0515   CALCIUM 8.6 02/16/2012 0515   PROT 8.1 02/08/2012 1001   ALBUMIN 4.1 02/08/2012 1001   AST 14 02/08/2012 1001   ALT 18 02/08/2012 1001   ALKPHOS 124* 02/08/2012 1001   BILITOT 0.8 02/08/2012 1001   GFRNONAA >90 02/16/2012 0515   GFRAA >90 02/16/2012 0515   Lipase     Component Value Date/Time   LIPASE 13 02/08/2012 1001       Studies/Results: No results found.  Anti-infectives: Anti-infectives     Start     Dose/Rate Route Frequency Ordered Stop   02/15/12 1000   ertapenem (INVANZ) 1 g in sodium chloride 0.9 % 50 mL IVPB        1 g 100 mL/hr over 30 Minutes Intravenous Every 24 hours 02/14/12 2016     02/14/12 2000    ertapenem (INVANZ) 1 g in sodium chloride 0.9 % 50 mL IVPB  Status:  Discontinued        1 g 100 mL/hr over 30 Minutes Intravenous Every 24 hours 02/14/12 1950 02/14/12 2016   02/14/12 0900   ertapenem (INVANZ) 1 g in sodium chloride 0.9 % 50 mL IVPB  Status:  Discontinued        1 g 100 mL/hr over 30 Minutes Intravenous Every 24 hours 02/14/12 0754 02/14/12 1950   02/12/12 2000   ciprofloxacin (CIPRO) tablet 500 mg  Status:  Discontinued        500 mg Oral 2 times daily 02/12/12 0929 02/12/12 1640   02/12/12 1645   ciprofloxacin (CIPRO) IVPB 400 mg  Status:  Discontinued        400 mg 200 mL/hr over 60 Minutes Intravenous Every 12 hours 02/12/12 1641 02/14/12 0754   02/12/12 1645   metroNIDAZOLE (FLAGYL) IVPB 500 mg  Status:  Discontinued        500 mg 100 mL/hr over 60 Minutes Intravenous Every 8 hours 02/12/12 1641 02/14/12 0754   02/12/12 1400   metroNIDAZOLE (FLAGYL) tablet 500 mg  Status:  Discontinued        500 mg Oral 3 times per day 02/12/12 0930 02/12/12 1640   02/09/12 1400  ciprofloxacin (CIPRO) IVPB 400 mg  Status:  Discontinued        400 mg 200 mL/hr over 60 Minutes Intravenous Every 12 hours 02/08/12 1556 02/12/12 0928   02/08/12 2200   metroNIDAZOLE (FLAGYL) IVPB 500 mg  Status:  Discontinued        500 mg 100 mL/hr over 60 Minutes Intravenous Every 8 hours 02/08/12 1556 02/12/12 0928   02/08/12 1445   ciprofloxacin (CIPRO) IVPB 400 mg        400 mg 200 mL/hr over 60 Minutes Intravenous  Once 02/08/12 1437 02/08/12 1545   02/08/12 1445   metroNIDAZOLE (FLAGYL) IVPB 500 mg        500 mg 100 mL/hr over 60 Minutes Intravenous  Once 02/08/12 1437 02/08/12 1935           Assessment/Plan  1. S/p Hartman's procedure for perf tics 2. Post op ileus  Plan: 1. Will advance to full liquids and then to regular diet in the morning.  If he tolerates this, hopefully he can be dc home tomorrow 2. Would like to cont abx therapy for 7 days post op   LOS: 12 days     Lorey Pallett E 02/20/2012, 8:17 AM Pager: [phone]

## 2012-02-21 ENCOUNTER — Telehealth (INDEPENDENT_AMBULATORY_CARE_PROVIDER_SITE_OTHER): Payer: Self-pay

## 2012-02-21 MED ORDER — OXYCODONE-ACETAMINOPHEN 5-325 MG PO TABS: 1.0000 | ORAL_TABLET | ORAL | Status: DC | PRN

## 2012-02-21 NOTE — Telephone Encounter (Signed)
LMOV appt for staple removal on 02/24/30 with the nurse.

## 2012-02-21 NOTE — Discharge Summary (Signed)
Patient ID: Philip Jimenez MRN: [id number] DOB/AGE: 07/12/1960 51 y.o.  Admit date: 02/08/2012 Discharge date: 02/21/2012  Procedures: Exploratory laparotomy with Hartman's procedure  Consults: None  Reason for Admission: 51 year old gentleman who's been in good health. On Tuesday he started having abdominal pain which got better after a bowel movement. He felt better on Tuesday the pain returned again on Thursday and Friday. Pain is significant her just to get out of bed. He has a history of diverticulitis and was treated back in December 2012 and subsequently underwent colonoscopy Dr. Loreta Ave in January 2013. He's continue to have worsening pain and chills no fever. Today he developed nausea vomiting and diarrhea. He presented to the ER at [facility]. WBC is 24,300. Hemoglobin 15 hematocrit 43 platelets 286,000 electrolytes and LFTs are all normal creatinine 0.91. CT scan shows significant sigmoid diverticulitis with evidence of microperforation with several small extraluminal locules of air. There is some prominent secondary inflammation and adjacent loop of the distal ileum, which may be a future abscess. Currently there is nothing drainable or obvious abscess at this time. Also has a small hiatal hernia and a left renal cyst.  Admission Diagnoses:  1. Acute diverticulitis with microperforation  2. Diverticulitis December 2012, colonoscopy January 2013 Dr. Loreta Ave  3. History of GI bleed 20 years ago.  4. Tobacco Use  Hospital Course: the patient was admitted and started on IV antibiotics and treated with conservative management.  He initially had a CT scan that revealed diverticulitis with a microperforation.  He slowly began to make improvement and was feeling better, all be it slow.  A repeat CT was ordered on HD# 5, to relook at this area.  This revealed an abscess, but thickening of his SB that was adjacent to this area in the sigmoid colon.  His scan overall looked worse.  He was then  taken to the operating room where he underwent an exploratory laparotomy with sigmoid colectomy/colostomy.  He tolerated this well.  He had an NGT in place postoperatively.  He had a post op ileus.  This began to resolve and his NGT was taken out on POD# 4.  His diet was able to be advanced as tolerated.  His stoma was pink and viable and putting out air and liquid stool.  His wound was partially close with staples and partially open.  This remained clean and stable with dressing changes.  The patient completed 7 days of abx therapy post operatively and therefore was not sent home with anymore antibiotics.  Home health was set up for wound and ostomy care.  PE: Abd: soft, minimally tender, wound is clean and packed, ostomy with air and stool present.   Discharge Diagnoses:  Principal Problem:  *Diverticulitis with perforation Active Problems:  Tobacco use  Hx of gastric ulcer  Diverticulosis s/p ex lap with Hartman's procedure  Discharge Medications:   Medication List     As of 02/21/2012  9:30 AM    TAKE these medications         acetaminophen 500 MG tablet   Commonly known as: TYLENOL   Take 1,000 mg by mouth every 6 (six) hours as needed. For pain      esomeprazole 40 MG capsule   Commonly known as: NEXIUM   Take 40 mg by mouth daily before breakfast.      oxyCODONE-acetaminophen 5-325 MG per tablet   Commonly known as: PERCOCET/ROXICET   Take 1-2 tablets by mouth every 4 (four) hours as needed  for pain.        Discharge Instructions:     Follow-up Information    Follow up with Milas Hock, CMA. (she will call you with time for staple removal)    Contact information:   [address] [place] [address]       Follow up with Adolph Pollack, MD. Schedule an appointment as soon as possible for a visit in 2 weeks.   Contact information:   [address] [place] [address] [phone]          Signed: Letha Cape 02/21/2012, 9:30 AM

## 2012-02-25 ENCOUNTER — Ambulatory Visit (INDEPENDENT_AMBULATORY_CARE_PROVIDER_SITE_OTHER): Payer: 59 | Admitting: General Surgery

## 2012-02-25 ENCOUNTER — Encounter (INDEPENDENT_AMBULATORY_CARE_PROVIDER_SITE_OTHER): Payer: Self-pay | Admitting: General Surgery

## 2012-02-25 VITALS — BP 116/66 | HR 68 | Temp 98.6°F | Resp 18 | Ht 70.0 in | Wt 171.0 lb

## 2012-02-25 DIAGNOSIS — Z4802 Encounter for removal of sutures: Secondary | ICD-10-CM

## 2012-02-25 NOTE — Patient Instructions (Signed)
Pt had staple removal and wet to dry dressing changes was placed over the wound. Pt stated that he is getting Home Heath nurse and a Family Friend do the dressing changes. Pt has a follow up appt with Dr Abbey Chatters on 03-11-2012 at 10:00. Pt was giving a card with the appt date and time

## 2012-03-11 ENCOUNTER — Encounter (INDEPENDENT_AMBULATORY_CARE_PROVIDER_SITE_OTHER): Payer: Self-pay | Admitting: General Surgery

## 2012-03-11 ENCOUNTER — Ambulatory Visit (INDEPENDENT_AMBULATORY_CARE_PROVIDER_SITE_OTHER): Payer: 59 | Admitting: General Surgery

## 2012-03-11 VITALS — BP 128/88 | HR 72 | Temp 97.8°F | Resp 16 | Ht 70.0 in | Wt 173.6 lb

## 2012-03-11 DIAGNOSIS — Z9889 Other specified postprocedural states: Secondary | ICD-10-CM

## 2012-03-11 NOTE — Patient Instructions (Signed)
Continue light activities and your current wound care.

## 2012-03-11 NOTE — Progress Notes (Signed)
Procedure:  Emergency sigmoid colectomy and colostomy for perforated diverticulitis  Date:  02/14/2012  Pathology:  Perforated diverticulitis, no malignancy  History:  He is here for his first postoperative visit and is doing well. The midline wound is healing by secondary intention and he is doing dressing changes. He is having some drainage from the upper portion of the wound. The colostomy is working well and he is able to manage it.  Exam: General- Is in NAD.  Abd-soft, open midline wound with some serous drainage and granulation tissue present, left sided colostomy is pink with brown stool output  Assessment:  Doing well post Hartmann procedure for perforated diverticulitis  Plan:  Continue light activities and wound care.  He eventually will need a colonoscopy prior to colostomy reversal.  Return visit in one month.

## 2012-04-09 ENCOUNTER — Other Ambulatory Visit (INDEPENDENT_AMBULATORY_CARE_PROVIDER_SITE_OTHER): Payer: Self-pay | Admitting: General Surgery

## 2012-04-09 ENCOUNTER — Encounter (INDEPENDENT_AMBULATORY_CARE_PROVIDER_SITE_OTHER): Payer: Self-pay | Admitting: General Surgery

## 2012-04-09 ENCOUNTER — Ambulatory Visit (INDEPENDENT_AMBULATORY_CARE_PROVIDER_SITE_OTHER): Payer: 59 | Admitting: General Surgery

## 2012-04-09 VITALS — BP 128/70 | HR 72 | Temp 97.8°F | Resp 20 | Ht 70.0 in | Wt 184.2 lb

## 2012-04-09 DIAGNOSIS — K5780 Diverticulitis of intestine, part unspecified, with perforation and abscess without bleeding: Secondary | ICD-10-CM

## 2012-04-09 DIAGNOSIS — Z9889 Other specified postprocedural states: Secondary | ICD-10-CM

## 2012-04-09 NOTE — Patient Instructions (Signed)
Activities as tolerated as we discussed. 

## 2012-04-09 NOTE — Progress Notes (Signed)
Procedure:  Emergency sigmoid colectomy and colostomy for perforated diverticulitis  Date:  02/14/2012  Pathology:  Perforated diverticulitis, no malignancy  History:  He is here for his second postoperative visit and continues to do well. The midline wound is healed.  The colostomy is working well.  Exam: General- Is in NAD.  Abd-soft,  midline wound is healed, left sided colostomy is pink with brown stool output  Assessment: Continues to do well.  Is back at work.  Plan:  Resume normal activities.  Refer for colonscopy.  Return visit in one month.

## 2012-04-15 ENCOUNTER — Telehealth (INDEPENDENT_AMBULATORY_CARE_PROVIDER_SITE_OTHER): Payer: Self-pay

## 2012-04-15 NOTE — Telephone Encounter (Signed)
Pt has a rescheduled appt with Dr. Evette Cristal, dx perforated diverticulitis, 04/22/12.

## 2012-04-29 ENCOUNTER — Telehealth (INDEPENDENT_AMBULATORY_CARE_PROVIDER_SITE_OTHER): Payer: Self-pay

## 2012-04-29 NOTE — Telephone Encounter (Signed)
Pt has seen Dr. Evette Cristal for a colonoscopy and has been okayed for reversal.  Will be discussed at his upcoming appointment with Dr. Abbey Chatters.

## 2012-05-08 ENCOUNTER — Ambulatory Visit (INDEPENDENT_AMBULATORY_CARE_PROVIDER_SITE_OTHER): Payer: 59 | Admitting: General Surgery

## 2012-05-08 ENCOUNTER — Encounter (INDEPENDENT_AMBULATORY_CARE_PROVIDER_SITE_OTHER): Payer: Self-pay | Admitting: General Surgery

## 2012-05-08 VITALS — BP 116/82 | HR 70 | Temp 97.9°F | Resp 14 | Ht 70.0 in | Wt 188.2 lb

## 2012-05-08 DIAGNOSIS — Z9889 Other specified postprocedural states: Secondary | ICD-10-CM

## 2012-05-08 NOTE — Progress Notes (Signed)
Patient ID: Philip Jimenez, male   DOB: 03-29-1961, 51 y.o.   MRN: [id number]  Chief Complaint  Patient presents with  . Routine Post Op    HPI Philip Jimenez is a 51 y.o. male.   HPI  He is doing well and has had his colonoscopy which reportedly did not show any neoplastic activity. His wound is completely healed. He has no complaints.  Past Medical History  Diagnosis Date  . Tobacco use 02/08/2012    Less than 1ppd for 36 years  . Hx of gastric ulcer 02/08/2012  . Diverticulosis 02/08/2012    With diverticulitis DEC 2012. Colonoscopy Jan 2013 DR. Mann  . Diverticulitis with perforation 02/08/2012    Past Surgical History  Procedure Date  . Colon resection 02/14/2012    Procedure: COLON RESECTION;  Surgeon: Adolph Pollack, MD;  Location: [facility] OR;  Service: General;  Laterality: N/A;    Family History  Problem Relation Age of Onset  . Cancer Brother     colon, lung    Social History History  Substance Use Topics  . Smoking status: Never Smoker   . Smokeless tobacco: Never Used  . Alcohol Use: No    Allergies  Allergen Reactions  . Penicillins Hives    He can take Ampicillin    Current Outpatient Prescriptions  Medication Sig Dispense Refill  . acetaminophen (TYLENOL) 500 MG tablet Take 1,000 mg by mouth every 6 (six) hours as needed. For pain        Review of Systems Review of Systems  Constitutional: Negative.   Gastrointestinal: Negative.     Blood pressure 116/82, pulse 70, temperature 97.9 F (36.6 C), resp. rate 14, height 5\' 10"  (1.778 m), weight 188 lb 3.2 oz (85.367 kg).  Physical Exam Physical Exam  Constitutional: He appears well-developed and well-nourished. No distress.  HENT:  Head: Normocephalic and atraumatic.  Neck: Neck supple.  Cardiovascular: Normal rate and regular rhythm.   Pulmonary/Chest: Effort normal and breath sounds normal.  Abdominal: Soft. He exhibits no mass. There is no tenderness.       Left-sided colostomy.  Midline  scar.  Neurological: He is alert.  Skin: Skin is warm and dry.  Psychiatric: He has a normal mood and affect. His behavior is normal.    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 He is ready for colostomy closure.    Plan    Laparoscopic assisted colostomy closure. One-day bowel prep.  I have explained the procedure and risks of colostomy closure.  Risks include but are not limited to bleeding, infection, wound problems, anesthesia, anastomotic leak, need for another colostomy, injury to intraabominal organs (such as intestine, spleen, kidney, bladder, ureter, etc.), ileus, irregular bowel habits.  He seems to understand and agrees to proceed.       Earsie Humm J 05/08/2012, 3:06 PM

## 2012-05-08 NOTE — Patient Instructions (Signed)
[facility]  ONE-DAY (1) PRE-OP HOME COLON PREP INSTRUCTIONS: ** MIRALAX / GATORADE PREP **  Fill the two prescriptions at a pharmacy of your choice.  You must follow the instructions below carefully.  If you have questions or problems, please call and speak to someone in the clinic department at our office:   [phone].  MIRALAX - GATORADE -- DULCOLAX TABS:   Fill the prescriptions for MIRALAX  (255 gm bottle)    In addition, purchase four (4) DULCOLAX TABLETS (no prescription required), and one 64 oz GATORADE.  (Do NOT purchase red Gatorade; any other flavor is acceptable).  ANITIBIOTICS:   There will be 2 different antibiotics.     Take both prescriptions THE AFTERNOON BEFORE your surgery, at the times written on the bottles.  INSTRUCTIONS: 1. Five days prior to your procedure do not eat nuts, popcorn, or fruit with seeds.  Stop all fiber supplements such as Metamucil, Citrucel, etc.  2. The day before your procedure: o 6:00am:  take (4) Dulcolax tablets.  You should remain on clear liquids for the entire day.   CLEAR LIQUIDS: clear bouillon, broth, jello (NOT RED), black coffee, tea, soda, etc o 10:00am:  add the bottle of MiraLax to the 64-oz bottle of Gatorade, and dissolve.  Begin drinking the Gatorade mixture until gone (8 oz every 15-30 minutes).  Continue clear liquids until midnight (or bedtime). o Take the antibiotics at the times instructed on the bottles.  3. The day of your procedure:   Do not eat or drink ANYTHING after midnight before your surgery.     If you take Heart or Blood Pressure medicine, ask the pre-op nurses about these during your preop appointment.   Further pre-operative instructions will be given to you from the hospital.   Expect to be contacted 5-7 days before your surgery.       

## 2012-06-01 ENCOUNTER — Other Ambulatory Visit (HOSPITAL_COMMUNITY): Payer: Self-pay | Admitting: *Deleted

## 2012-06-01 ENCOUNTER — Encounter (HOSPITAL_COMMUNITY): Payer: Self-pay | Admitting: Pharmacy Technician

## 2012-06-01 NOTE — Patient Instructions (Addendum)
20 Philip Jimenez  06/01/2012   Your procedure is scheduled on:  06/11/12  THURSDAY  Report to [facility] at   0700    AM.  Call this number if you have problems the morning of surgery: [phone]       Remember:   Do not eat food after midnight Tuesday NIGHT-    DRINK CLEAR LIQUIDS ALL DAY Wednesday, INCREASE FLUIDS Wednesday, DRINK UNTIL  BEDTIME OR MIDNIGHT Wednesday NIGHT-  THEN NOTHING BY MOUTH                                         BOWEL PREP AS PER OFFICE   Take these medicines the morning of surgery with A SIP OF WATER:   .  Contacts, dentures or partial plates can not be worn to surgery  Leave suitcase in the car. After surgery it may be brought to your room.  For patients admitted to the hospital, checkout time is 11:00 AM day of  discharge.             SPECIAL INSTRUCTIONS- SEE Coy PREPARING FOR SURGERY INSTRUCTION SHEET-     DO NOT WEAR JEWELRY, LOTIONS, POWDERS, OR PERFUMES.  WOMEN-- DO NOT SHAVE LEGS OR UNDERARMS FOR 12 HOURS BEFORE SHOWERS. MEN MAY SHAVE FACE.  Patients discharged the day of surgery will not be allowed to drive home. IF going home the day of surgery, you must have a driver and someone to stay with you for the first 24 hours  Name and phone number of your driver:   admission          Jacki Cones-- Fiance                                                            Please read over the following fact sheets that you were given: MRSA Information, Incentive Spirometry Sheet, Blood Transfusion Sheet  Information                                                                                   [name]  PST 336  [phone]                 FAILURE TO FOLLOW THESE INSTRUCTIONS MAY RESULT IN  CANCELLATION   OF YOUR SURGERY                                                  Patient Signature _____________________________

## 2012-06-02 ENCOUNTER — Encounter (HOSPITAL_COMMUNITY)
Admission: RE | Admit: 2012-06-02 | Discharge: 2012-06-02 | Disposition: A | Discharge status: Home / Self Care | Payer: 59 | Source: Ambulatory Visit | Attending: General Surgery | Admitting: General Surgery

## 2012-06-02 ENCOUNTER — Ambulatory Visit (HOSPITAL_COMMUNITY)
Admission: RE | Admit: 2012-06-02 | Discharge: 2012-06-02 | Disposition: A | Discharge status: Home / Self Care | Payer: 59 | Source: Ambulatory Visit | Attending: General Surgery | Admitting: General Surgery

## 2012-06-02 ENCOUNTER — Encounter (HOSPITAL_COMMUNITY): Payer: Self-pay

## 2012-06-02 DIAGNOSIS — Z01812 Encounter for preprocedural laboratory examination: Secondary | ICD-10-CM | POA: Insufficient documentation

## 2012-06-02 DIAGNOSIS — Z01818 Encounter for other preprocedural examination: Secondary | ICD-10-CM | POA: Insufficient documentation

## 2012-06-02 HISTORY — DX: Gastro-esophageal reflux disease without esophagitis: K21.9

## 2012-06-02 LAB — CBC WITH DIFFERENTIAL/PLATELET
Basophils Relative: 0 % (ref 0–1)
Eosinophils Absolute: 0.2 10*3/uL (ref 0.0–0.7)
Eosinophils Relative: 3 % (ref 0–5)
HCT: 44.1 % (ref 39.0–52.0)
Hemoglobin: 15.7 g/dL (ref 13.0–17.0)
Lymphocytes Relative: 43 % (ref 12–46)
Lymphs Abs: 2.9 10*3/uL (ref 0.7–4.0)
MCV: 89.8 fL (ref 78.0–100.0)
Monocytes Absolute: 0.5 10*3/uL (ref 0.1–1.0)
Monocytes Relative: 7 % (ref 3–12)
Neutro Abs: 3.1 10*3/uL (ref 1.7–7.7)
RBC: 4.91 MIL/uL (ref 4.22–5.81)
WBC: 6.8 10*3/uL (ref 4.0–10.5)

## 2012-06-02 LAB — SURGICAL PCR SCREEN: Staphylococcus aureus: NEGATIVE

## 2012-06-02 LAB — COMPREHENSIVE METABOLIC PANEL
ALT: 21 U/L (ref 0–53)
Albumin: 4 g/dL (ref 3.5–5.2)
Alkaline Phosphatase: 91 U/L (ref 39–117)
BUN: 16 mg/dL (ref 6–23)
Potassium: 4.7 mEq/L (ref 3.5–5.1)
Sodium: 140 mEq/L (ref 135–145)
Total Protein: 7.5 g/dL (ref 6.0–8.3)

## 2012-06-02 LAB — PROTIME-INR: Prothrombin Time: 12.2 seconds (ref 11.6–15.2)

## 2012-06-02 NOTE — Progress Notes (Signed)
PST  VISIT--  Chest x ray done today per protocol due to abnormal CT abdomen done 9/13

## 2012-06-11 ENCOUNTER — Encounter (HOSPITAL_COMMUNITY)
Admission: RE | Disposition: A | Discharge status: Home / Self Care | Payer: Self-pay | Source: Ambulatory Visit | Attending: General Surgery

## 2012-06-11 ENCOUNTER — Inpatient Hospital Stay (HOSPITAL_COMMUNITY)
Admission: RE | Admit: 2012-06-11 | Discharge: 2012-06-15 | DRG: 331 | Disposition: A | Discharge status: Home / Self Care | Payer: 59 | Source: Ambulatory Visit | Attending: General Surgery | Admitting: General Surgery

## 2012-06-11 ENCOUNTER — Encounter (HOSPITAL_COMMUNITY): Payer: Self-pay | Admitting: Anesthesiology

## 2012-06-11 ENCOUNTER — Encounter (HOSPITAL_COMMUNITY): Payer: Self-pay | Admitting: *Deleted

## 2012-06-11 ENCOUNTER — Ambulatory Visit (HOSPITAL_COMMUNITY): Payer: 59 | Admitting: Anesthesiology

## 2012-06-11 DIAGNOSIS — Z9049 Acquired absence of other specified parts of digestive tract: Secondary | ICD-10-CM

## 2012-06-11 DIAGNOSIS — K660 Peritoneal adhesions (postprocedural) (postinfection): Secondary | ICD-10-CM

## 2012-06-11 DIAGNOSIS — Z433 Encounter for attention to colostomy: Secondary | ICD-10-CM

## 2012-06-11 DIAGNOSIS — Z8719 Personal history of other diseases of the digestive system: Secondary | ICD-10-CM

## 2012-06-11 DIAGNOSIS — F17200 Nicotine dependence, unspecified, uncomplicated: Secondary | ICD-10-CM | POA: Diagnosis present

## 2012-06-11 DIAGNOSIS — K219 Gastro-esophageal reflux disease without esophagitis: Secondary | ICD-10-CM | POA: Diagnosis present

## 2012-06-11 DIAGNOSIS — Z880 Allergy status to penicillin: Secondary | ICD-10-CM

## 2012-06-11 DIAGNOSIS — Z933 Colostomy status: Secondary | ICD-10-CM

## 2012-06-11 DIAGNOSIS — Z79899 Other long term (current) drug therapy: Secondary | ICD-10-CM

## 2012-06-11 HISTORY — PX: COLON RESECTION: SHX5231

## 2012-06-11 HISTORY — PX: COLOSTOMY TAKEDOWN: SHX5258

## 2012-06-11 LAB — TYPE AND SCREEN: ABO/RH(D): B POS

## 2012-06-11 LAB — ABO/RH: ABO/RH(D): B POS

## 2012-06-11 SURGERY — CLOSURE, COLOSTOMY, LAPAROSCOPIC
Anesthesia: General | Site: Abdomen | Wound class: Contaminated

## 2012-06-11 MED ORDER — KCL-LACTATED RINGERS-D5W 20 MEQ/L IV SOLN
INTRAVENOUS | Status: DC
  Administered 2012-06-11 – 2012-06-12 (×2): via INTRAVENOUS
  Administered 2012-06-12: 125 mL/h via INTRAVENOUS
  Administered 2012-06-12 – 2012-06-13 (×3): via INTRAVENOUS
  Administered 2012-06-13: 125 mL/h via INTRAVENOUS
  Administered 2012-06-14 – 2012-06-15 (×3): via INTRAVENOUS
  Filled 2012-06-11 (×11): qty 1000

## 2012-06-11 MED ORDER — NALOXONE HCL 0.4 MG/ML IJ SOLN: 0.4000 mg | INTRAMUSCULAR | Status: DC | PRN

## 2012-06-11 MED ORDER — ALVIMOPAN 12 MG PO CAPS
12.0000 mg | ORAL_CAPSULE | Freq: Once | ORAL | Status: AC
  Administered 2012-06-11: 12 mg via ORAL
  Filled 2012-06-11: qty 1

## 2012-06-11 MED ORDER — BUPIVACAINE HCL (PF) 0.5 % IJ SOLN
INTRAMUSCULAR | Status: DC | PRN
  Administered 2012-06-11: 15 mL

## 2012-06-11 MED ORDER — SUCCINYLCHOLINE CHLORIDE 20 MG/ML IJ SOLN
INTRAMUSCULAR | Status: DC | PRN
  Administered 2012-06-11: 100 mg via INTRAVENOUS

## 2012-06-11 MED ORDER — PROPOFOL 10 MG/ML IV BOLUS
INTRAVENOUS | Status: DC | PRN
  Administered 2012-06-11: 200 mg via INTRAVENOUS

## 2012-06-11 MED ORDER — GLYCOPYRROLATE 0.2 MG/ML IJ SOLN
INTRAMUSCULAR | Status: DC | PRN
  Administered 2012-06-11: 0.6 mg via INTRAVENOUS

## 2012-06-11 MED ORDER — LACTATED RINGERS IV SOLN: INTRAVENOUS | Status: DC

## 2012-06-11 MED ORDER — LACTATED RINGERS IV SOLN
INTRAVENOUS | Status: DC | PRN
  Administered 2012-06-11 (×4): via INTRAVENOUS

## 2012-06-11 MED ORDER — DIPHENHYDRAMINE HCL 50 MG/ML IJ SOLN: 12.5000 mg | Freq: Four times a day (QID) | INTRAMUSCULAR | Status: DC | PRN

## 2012-06-11 MED ORDER — CEFOXITIN SODIUM-DEXTROSE 1-4 GM-% IV SOLR (PREMIX)
INTRAVENOUS | Status: AC
  Filled 2012-06-11: qty 100

## 2012-06-11 MED ORDER — HEPARIN SODIUM (PORCINE) 5000 UNIT/ML IJ SOLN
5000.0000 [IU] | Freq: Three times a day (TID) | INTRAMUSCULAR | Status: DC
  Administered 2012-06-12 – 2012-06-15 (×9): 5000 [IU] via SUBCUTANEOUS
  Filled 2012-06-11 (×12): qty 1

## 2012-06-11 MED ORDER — ONDANSETRON HCL 4 MG/2ML IJ SOLN
4.0000 mg | INTRAMUSCULAR | Status: DC | PRN
  Administered 2012-06-12 (×3): 4 mg via INTRAVENOUS
  Filled 2012-06-11 (×6): qty 2

## 2012-06-11 MED ORDER — HYDROMORPHONE 0.3 MG/ML IV SOLN
INTRAVENOUS | Status: DC
  Administered 2012-06-11: 3.9 mg via INTRAVENOUS
  Administered 2012-06-11: 14:00:00 via INTRAVENOUS
  Administered 2012-06-12: 0.399 mg via INTRAVENOUS
  Administered 2012-06-12: 1.97 mg via INTRAVENOUS
  Administered 2012-06-12: 1.19 mg via INTRAVENOUS
  Administered 2012-06-12: 1.39 mg via INTRAVENOUS
  Administered 2012-06-12: 0.399 mg via INTRAVENOUS
  Administered 2012-06-13 (×2): 0.4 mg via INTRAVENOUS
  Administered 2012-06-13: 0.399 mg via INTRAVENOUS
  Administered 2012-06-13: 0.2 mg via INTRAVENOUS
  Administered 2012-06-13: 08:00:00 via INTRAVENOUS
  Administered 2012-06-13: 0.599 mg via INTRAVENOUS
  Administered 2012-06-13: 0.2 mg via INTRAVENOUS
  Filled 2012-06-11 (×2): qty 25

## 2012-06-11 MED ORDER — ALVIMOPAN 12 MG PO CAPS
12.0000 mg | ORAL_CAPSULE | Freq: Two times a day (BID) | ORAL | Status: DC
  Administered 2012-06-12 (×2): 12 mg via ORAL
  Filled 2012-06-11 (×8): qty 1

## 2012-06-11 MED ORDER — LIDOCAINE HCL 1 % IJ SOLN
INTRAMUSCULAR | Status: DC | PRN
  Administered 2012-06-11: 100 mg via INTRADERMAL

## 2012-06-11 MED ORDER — ONDANSETRON HCL 4 MG/2ML IJ SOLN
4.0000 mg | Freq: Four times a day (QID) | INTRAMUSCULAR | Status: DC | PRN
  Administered 2012-06-12 – 2012-06-13 (×2): 4 mg via INTRAVENOUS

## 2012-06-11 MED ORDER — MIDAZOLAM HCL 5 MG/5ML IJ SOLN
INTRAMUSCULAR | Status: DC | PRN
  Administered 2012-06-11: 2 mg via INTRAVENOUS

## 2012-06-11 MED ORDER — ONDANSETRON HCL 4 MG/2ML IJ SOLN
INTRAMUSCULAR | Status: AC
  Filled 2012-06-11: qty 2

## 2012-06-11 MED ORDER — FENTANYL CITRATE 0.05 MG/ML IJ SOLN
INTRAMUSCULAR | Status: DC | PRN
  Administered 2012-06-11: 100 ug via INTRAVENOUS
  Administered 2012-06-11: 150 ug via INTRAVENOUS

## 2012-06-11 MED ORDER — ONDANSETRON HCL 4 MG PO TABS: 4.0000 mg | ORAL_TABLET | Freq: Four times a day (QID) | ORAL | Status: DC | PRN

## 2012-06-11 MED ORDER — GLYCOPYRROLATE 0.2 MG/ML IJ SOLN: INTRAMUSCULAR | Status: DC | PRN

## 2012-06-11 MED ORDER — DEXTROSE 5 % IV SOLN
2.0000 g | INTRAVENOUS | Status: AC
  Administered 2012-06-11: 2 g via INTRAVENOUS
  Filled 2012-06-11: qty 2

## 2012-06-11 MED ORDER — DEXTROSE 5 % IV SOLN
1.0000 g | Freq: Four times a day (QID) | INTRAVENOUS | Status: AC
  Administered 2012-06-11 – 2012-06-12 (×3): 1 g via INTRAVENOUS
  Filled 2012-06-11 (×4): qty 1

## 2012-06-11 MED ORDER — ACETAMINOPHEN 10 MG/ML IV SOLN
INTRAVENOUS | Status: DC | PRN
  Administered 2012-06-11: 1000 mg via INTRAVENOUS

## 2012-06-11 MED ORDER — SODIUM CHLORIDE 0.9 % IJ SOLN: 9.0000 mL | INTRAMUSCULAR | Status: DC | PRN

## 2012-06-11 MED ORDER — NEOSTIGMINE METHYLSULFATE 1 MG/ML IJ SOLN
INTRAMUSCULAR | Status: DC | PRN
  Administered 2012-06-11: 2 mg via INTRAVENOUS

## 2012-06-11 MED ORDER — CISATRACURIUM BESYLATE (PF) 10 MG/5ML IV SOLN
INTRAVENOUS | Status: DC | PRN
  Administered 2012-06-11: 10 mg via INTRAVENOUS
  Administered 2012-06-11 (×3): 3 mg via INTRAVENOUS
  Administered 2012-06-11: 4 mg via INTRAVENOUS

## 2012-06-11 MED ORDER — HYDROMORPHONE 0.3 MG/ML IV SOLN
INTRAVENOUS | Status: AC
  Filled 2012-06-11: qty 25

## 2012-06-11 MED ORDER — HYDROMORPHONE HCL PF 1 MG/ML IJ SOLN
INTRAMUSCULAR | Status: DC | PRN
Start: 2012-06-11 — End: 2012-06-11
  Administered 2012-06-11: 1 mg via INTRAVENOUS

## 2012-06-11 MED ORDER — ACETAMINOPHEN 10 MG/ML IV SOLN
INTRAVENOUS | Status: AC
  Filled 2012-06-11: qty 100

## 2012-06-11 MED ORDER — DIPHENHYDRAMINE HCL 12.5 MG/5ML PO ELIX: 12.5000 mg | ORAL_SOLUTION | Freq: Four times a day (QID) | ORAL | Status: DC | PRN

## 2012-06-11 MED ORDER — LACTATED RINGERS IR SOLN
Status: DC | PRN
  Administered 2012-06-11 (×2): 1000 mL

## 2012-06-11 MED ORDER — BUPIVACAINE HCL (PF) 0.5 % IJ SOLN
INTRAMUSCULAR | Status: AC
  Filled 2012-06-11: qty 30

## 2012-06-11 MED ORDER — ONDANSETRON HCL 4 MG/2ML IJ SOLN
INTRAMUSCULAR | Status: DC | PRN
  Administered 2012-06-11: 4 mg via INTRAVENOUS

## 2012-06-11 MED ORDER — HYDROMORPHONE HCL PF 1 MG/ML IJ SOLN: 0.2500 mg | INTRAMUSCULAR | Status: DC | PRN

## 2012-06-11 SURGICAL SUPPLY — 94 items
APPLIER CLIP 5 13 M/L LIGAMAX5 (MISCELLANEOUS)
APPLIER CLIP ROT 10 11.4 M/L (STAPLE)
APR CLP MED LRG 11.4X10 (STAPLE)
APR CLP MED LRG 5 ANG JAW (MISCELLANEOUS)
BAG SPEC RTRVL LRG 6X4 10 (ENDOMECHANICALS) ×2
BLADE EXTENDED COATED 6.5IN (ELECTRODE) ×1 IMPLANT
BLADE HEX COATED 2.75 (ELECTRODE) ×3 IMPLANT
BLADE SURG SZ10 CARB STEEL (BLADE) ×3 IMPLANT
CABLE HIGH FREQUENCY MONO STRZ (ELECTRODE) ×3 IMPLANT
CANISTER SUCTION 2500CC (MISCELLANEOUS) ×4 IMPLANT
CANNULA ENDOPATH XCEL 11M (ENDOMECHANICALS) IMPLANT
CELLS DAT CNTRL 66122 CELL SVR (MISCELLANEOUS) IMPLANT
CLIP APPLIE 5 13 M/L LIGAMAX5 (MISCELLANEOUS) IMPLANT
CLIP APPLIE ROT 10 11.4 M/L (STAPLE) IMPLANT
CLOTH BEACON ORANGE TIMEOUT ST (SAFETY) ×3 IMPLANT
COVER MAYO STAND STRL (DRAPES) ×3 IMPLANT
DECANTER SPIKE VIAL GLASS SM (MISCELLANEOUS) ×3 IMPLANT
DISSECTOR BLUNT TIP ENDO 5MM (MISCELLANEOUS) ×1 IMPLANT
DRAIN CHANNEL 19F RND (DRAIN) ×1 IMPLANT
DRAPE LAPAROSCOPIC ABDOMINAL (DRAPES) ×3 IMPLANT
DRAPE LG THREE QUARTER DISP (DRAPES) ×2 IMPLANT
DRAPE WARM FLUID 44X44 (DRAPE) ×3 IMPLANT
DRSG PAD ABDOMINAL 8X10 ST (GAUZE/BANDAGES/DRESSINGS) ×1 IMPLANT
ELECT REM PT RETURN 9FT ADLT (ELECTROSURGICAL) ×3
ELECTRODE REM PT RTRN 9FT ADLT (ELECTROSURGICAL) ×2 IMPLANT
EVACUATOR 1/8 PVC DRAIN (DRAIN) ×1 IMPLANT
FILTER SMOKE EVAC LAPAROSHD (FILTER) IMPLANT
GLOVE BIOGEL PI IND STRL 7.0 (GLOVE) ×2 IMPLANT
GLOVE BIOGEL PI INDICATOR 7.0 (GLOVE) ×1
GLOVE ECLIPSE 6.5 STRL STRAW (GLOVE) ×5 IMPLANT
GLOVE ECLIPSE 8.0 STRL XLNG CF (GLOVE) ×6 IMPLANT
GLOVE INDICATOR 8.0 STRL GRN (GLOVE) ×6 IMPLANT
GLOVE SS BIOGEL STRL SZ 7 (GLOVE) IMPLANT
GLOVE SUPERSENSE BIOGEL SZ 7 (GLOVE) ×2
GLOVE SURG SS PI 7.0 STRL IVOR (GLOVE) ×3 IMPLANT
GLOVE SURG SS PI 7.5 STRL IVOR (GLOVE) ×1 IMPLANT
GOWN STRL NON-REIN LRG LVL3 (GOWN DISPOSABLE) ×4 IMPLANT
GOWN STRL REIN XL XLG (GOWN DISPOSABLE) ×10 IMPLANT
HANDLE STAPLE ENDO GIA SHORT (STAPLE) ×1
KIT BASIN OR (CUSTOM PROCEDURE TRAY) ×3 IMPLANT
LEGGING LITHOTOMY PAIR STRL (DRAPES) ×1 IMPLANT
LIGASURE IMPACT 36 18CM CVD LR (INSTRUMENTS) IMPLANT
LUBRICANT JELLY K Y 4OZ (MISCELLANEOUS) ×1 IMPLANT
NS IRRIG 1000ML POUR BTL (IV SOLUTION) ×6 IMPLANT
PENCIL BUTTON HOLSTER BLD 10FT (ELECTRODE) ×3 IMPLANT
POUCH SPECIMEN RETRIEVAL 10MM (ENDOMECHANICALS) ×1 IMPLANT
RELOAD EGIA 60 MED/THCK PURPLE (STAPLE) ×3 IMPLANT
RELOAD EGIA 60 TAN VASC (STAPLE) ×1 IMPLANT
RELOAD STAPLE 60 BLK XTHK ART (STAPLE) IMPLANT
RELOAD STAPLE 60 MED/THCK ART (STAPLE) IMPLANT
RELOAD TRI 2.0 60 XTHK VAS SUL (STAPLE) ×3 IMPLANT
RETRACTOR WND ALEXIS 18 MED (MISCELLANEOUS) IMPLANT
RTRCTR WOUND ALEXIS 18CM MED (MISCELLANEOUS)
SCALPEL HARMONIC ACE (MISCELLANEOUS) ×1 IMPLANT
SCISSORS LAP 5X35 DISP (ENDOMECHANICALS) ×3 IMPLANT
SET IRRIG TUBING LAPAROSCOPIC (IRRIGATION / IRRIGATOR) ×1 IMPLANT
SLEEVE XCEL OPT CAN 5 100 (ENDOMECHANICALS) IMPLANT
SOLUTION ANTI FOG 6CC (MISCELLANEOUS) ×3 IMPLANT
SPONGE GAUZE 4X4 12PLY (GAUZE/BANDAGES/DRESSINGS) ×3 IMPLANT
SPONGE LAP 18X18 X RAY DECT (DISPOSABLE) ×6 IMPLANT
STAPLER CIRC CVD 29MM 37CM (STAPLE) ×1 IMPLANT
STAPLER ENDO GIA 12 SHRT THIN (STAPLE) IMPLANT
STAPLER ENDO GIA 12MM SHORT (STAPLE) ×2 IMPLANT
STAPLER PROXIMATE 75MM BLUE (STAPLE) ×1 IMPLANT
STAPLER VISISTAT 35W (STAPLE) ×3 IMPLANT
SUCTION POOLE TIP (SUCTIONS) ×3 IMPLANT
SUT ETHILON 3 0 PS 1 (SUTURE) ×1 IMPLANT
SUT MNCRL AB 4-0 PS2 18 (SUTURE) ×1 IMPLANT
SUT PDS AB 1 CTX 36 (SUTURE) ×2 IMPLANT
SUT PDS AB 1 TP1 96 (SUTURE) IMPLANT
SUT PROLENE 2 0 SH DA (SUTURE) ×1 IMPLANT
SUT SILK 2 0 (SUTURE) ×3
SUT SILK 2 0 SH CR/8 (SUTURE) ×3 IMPLANT
SUT SILK 2-0 18XBRD TIE 12 (SUTURE) ×2 IMPLANT
SUT SILK 3 0 (SUTURE) ×3
SUT SILK 3 0 SH CR/8 (SUTURE) ×3 IMPLANT
SUT SILK 3-0 18XBRD TIE 12 (SUTURE) ×2 IMPLANT
SUT VICRYL 2 0 18  UND BR (SUTURE) ×1
SUT VICRYL 2 0 18 UND BR (SUTURE) ×2 IMPLANT
SYS LAPSCP GELPORT 120MM (MISCELLANEOUS)
SYSTEM LAPSCP GELPORT 120MM (MISCELLANEOUS) IMPLANT
TAPE CLOTH SURG 4X10 WHT LF (GAUZE/BANDAGES/DRESSINGS) ×1 IMPLANT
TOWEL OR 17X26 10 PK STRL BLUE (TOWEL DISPOSABLE) ×7 IMPLANT
TRAY FOLEY CATH 14FRSI W/METER (CATHETERS) ×3 IMPLANT
TRAY LAP CHOLE (CUSTOM PROCEDURE TRAY) ×3 IMPLANT
TROCAR BLADELESS OPT 5 100 (ENDOMECHANICALS) IMPLANT
TROCAR BLADELESS OPT 5 75 (ENDOMECHANICALS) ×8 IMPLANT
TROCAR HASSON GELL 12X100 (TROCAR) ×1 IMPLANT
TROCAR XCEL BLUNT TIP 100MML (ENDOMECHANICALS) IMPLANT
TROCAR XCEL NON-BLD 11X100MML (ENDOMECHANICALS) IMPLANT
TUBING CONNECTING 10 (TUBING) ×1 IMPLANT
TUBING INSUFFLATION 10FT LAP (TUBING) ×3 IMPLANT
YANKAUER SUCT BULB TIP 10FT TU (MISCELLANEOUS) ×3 IMPLANT
YANKAUER SUCT BULB TIP NO VENT (SUCTIONS) ×3 IMPLANT

## 2012-06-11 NOTE — Op Note (Signed)
Operative Note  Philip Jimenez male 52 y.o. 06/11/2012  PREOPERATIVE DX:   Descending colostomy status post Hartmann procedure for perforated diverticulitis  POSTOPERATIVE DX:  Same  PROCEDURE:  Laparoscopic colostomy closure with partial colectomy, laparoscopic lysis of adhesions for one hour,         Surgeon: Adolph Pollack   Assistants: Romie Levee M.D.  Anesthesia: General endotracheal anesthesia  Indications:   This is a 52 year old male who underwent an emergency sigmoid colectomy and colostomy for perforated sigmoid diverticulitis in September of 2013. He has done well and now presents for colostomy closure.    Procedure Detail:  He was brought to the operating room placed supine on the operating table and a general anesthetic was given. He was placed in the lithotomy position. A Foley catheter was inserted and a nasogastric tube was inserted. The hair on the abdominal wall was clipped. The colostomy appliance was removed. The abdominal wall and perineal areas were sterilely prepped and draped. Betadine soaked gauze followed by Tegaderm were placed over the colostomy.  A 5 mm incision was made in the right upper quadrant. Using a 5 mm Optiview trocar a laparoscope access to the peritoneal cavity was gained. Inspection of the area beneath a trocar demonstrated no evidence of an organ injury or bleeding. Adhesions were noted between the omentum and midline abdominal wall. A 5 mm trocar was placed in the right lower quadrant and adhesions were divided using the scissors and harmonic scalpel.  A 5 mm trocar was placed in the lower midline through the old scar in the supraumbilical area through the old scar. Adhesions between the small bowel and colostomy area were divided sharply. The rectal stump was identified and adhesions between it and the lateral abdominal wall were divided sharply. I identified some adhesions medially as well and was able to mobilize the rectal stump out of  the pelvis. More adhesions between the small bowel and lateral abdominal wall on the left side were divided sharply. Total lysis of adhesions took one hour.  An elliptical incision was made around the colostomy. It was dissected free of the subcutaneous tissue and muscle. There was significant length of left colon present. A size 29 anvil was then placed in the descending colon through the colostomy. Using a GIA stapler a segmental colectomy to include the colostomy was performed. I brought the anvil out the side of the descending colon. It was secured with a 2-0 prolene pursestring suture.  This was dropped back into the abdominal cavity. The colotomy site fascia was then closed with running #1 PDS suture in a Was left before the sutures were tied down. A Hassan trocar was introduced through this gap.  An EEA anastomosis was planned. The 25 dilator was introduced through the anus. This was followed by the handle of the EEA stapler. The handle was not able to be advanced to the full length of the rectal stump. The excess rectal stump was removed by first dividing the mesentery with the harmonic scalpel and stapler. Then the excess rectum was divided with the endoscopic stapler. Following this, an end rectum to side descending colon anastomosis was performed with the 29 EEA stapler. There was no tension on the anastomosis. The air leak test was performed and no leak was noted.  The anastomosis was widely patent.  The segment of resected rectum was then placed into the retrieval bag and removed through the previous colostomy site after loosening up some of the closing sutures. Following this  I then tied the 1 PDS sutures together closing the colostomy site fascia. The deep pelvic area and abdominal area were copiously irrigated with saline. A four-quadrant inspection was performed. There is no evidence of bleeding or organ injury. A 19 Blake drain was then placed into the pelvis and exited out the right lower  quadrant trocar site. It was anchored to the skin with a 3-0 nylon suture. The remaining trocars were removed and the CO2 gas released.  The trocar site skin incisions were closed with 4-0 Monocryl subcuticular stitches. The colostomy site subcutaneous tissue was packed with saline moistened gauze. Dry dressings were applied. The drain was hooked up to bulb suction.  He tolerated the procedure well without any apparent complications and was taken to the recovery room in satisfactory condition.  Estimated Blood Loss:  300 mL         Drains: JACKSON-PRATT (JP)  Blood Given: none          Specimens: Segment of colon with colostomy. Segment of rectum.        Complications:  * No complications entered in OR log *         Disposition: PACU - hemodynamically stable.         Condition: stable

## 2012-06-11 NOTE — Transfer of Care (Signed)
Immediate Anesthesia Transfer of Care Note  Patient: Philip Jimenez  Procedure(s) Performed: Procedure(s) (LRB) with comments: LAPAROSCOPIC COLOSTOMY TAKEDOWN (N/A) - Laparoscopic Assisted Colostomy Closure COLON RESECTION LAPAROSCOPIC ()  Patient Location: PACU  Anesthesia Type:General  Level of Consciousness: awake, alert  and oriented  Airway & Oxygen Therapy: Patient Spontanous Breathing and Patient connected to face mask oxygen  Post-op Assessment: Report given to PACU RN  Post vital signs: Reviewed and stable  Complications: No apparent anesthesia complications

## 2012-06-11 NOTE — H&P (Signed)
Philip Jimenez is an 52 y.o. male.   Chief Complaint:   Here for elective colostomy closure HPI:  He is s/p hartmann procedure 01/2012 for perforated sigmoid diverticulitis.  He presents now for colostomy closure.  Past Medical History  Diagnosis Date  . Tobacco use 02/08/2012    Less than 1ppd for 36 years  . Hx of gastric ulcer 02/08/2012  . Diverticulosis 02/08/2012    With diverticulitis DEC 2012. Colonoscopy Jan 2013 DR. Mann  . Diverticulitis with perforation 02/08/2012  . GERD (gastroesophageal reflux disease)     Past Surgical History  Procedure Date  . Colon resection 02/14/2012    Procedure: COLON RESECTION;  Surgeon: Adolph Pollack, MD;  Location: [facility] OR;  Service: General;  Laterality: N/A;    Family History  Problem Relation Age of Onset  . Cancer Brother     colon, lung   Social History:  reports that he has been smoking Cigarettes.  He has never used smokeless tobacco. He reports that he does not drink alcohol or use illicit drugs.  Allergies:  Allergies  Allergen Reactions  . Penicillins Hives    He can take Ampicillin    Medications Prior to Admission  Medication Sig Dispense Refill  . acetaminophen (TYLENOL) 500 MG tablet Take 1,000 mg by mouth every 6 (six) hours as needed. For pain        Results for orders placed during the hospital encounter of 06/11/12 (from the past 48 hour(s))  TYPE AND SCREEN     Status: Normal   Collection Time   06/11/12  7:10 AM      Component Value Range Comment   ABO/RH(D) B POS      Antibody Screen NEG      Sample Expiration 06/14/2012     ABO/RH     Status: Normal   Collection Time   06/11/12  7:30 AM      Component Value Range Comment   ABO/RH(D) B POS      No results found.  Review of Systems  Constitutional: Negative for fever and chills.  Gastrointestinal: Negative for nausea, vomiting and abdominal pain.    Blood pressure 129/95, pulse 99, temperature 97 F (36.1 C), temperature source Oral, resp. rate 18,  SpO2 97.00%. Physical Exam  Constitutional: He appears well-developed and well-nourished. No distress.  HENT:  Head: Normocephalic and atraumatic.  Cardiovascular: Normal rate.   Respiratory: Effort normal and breath sounds normal.  GI: He exhibits no mass. There is no tenderness.       Left sided colostomy.  Midline scar.  Musculoskeletal: He exhibits no edema.  Neurological: He is alert.  Skin: Skin is warm and dry.     Assessment/Plan Descending colostomy post Hartmann procedure for perforated sigmoid diverticulitis  Plan: Laparoscopic assisted colostomy closure.  Tyrail Grandfield J 06/11/2012, 9:25 AM

## 2012-06-11 NOTE — Anesthesia Postprocedure Evaluation (Signed)
  Anesthesia Post-op Note  Patient: Philip Jimenez  Procedure(s) Performed: Procedure(s) (LRB): LAPAROSCOPIC COLOSTOMY TAKEDOWN (N/A) COLON RESECTION LAPAROSCOPIC ()  Patient Location: PACU  Anesthesia Type: General  Level of Consciousness: awake and alert   Airway and Oxygen Therapy: Patient Spontanous Breathing  Post-op Pain: mild  Post-op Assessment: Post-op Vital signs reviewed, Patient's Cardiovascular Status Stable, Respiratory Function Stable, Patent Airway and No signs of Nausea or vomiting  Last Vitals:  Filed Vitals:   06/11/12 1345  BP: 129/88  Pulse: 92  Temp:   Resp: 14    Post-op Vital Signs: stable   Complications: No apparent anesthesia complications

## 2012-06-11 NOTE — Interval H&P Note (Signed)
History and Physical Interval Note:  06/11/2012 9:28 AM  Philip Jimenez  has presented today for surgery, with the diagnosis of Descending colostomy  The various methods of treatment have been discussed with the patient and family. After consideration of risks, benefits and other options for treatment, the patient has consented to  Procedure(s) (LRB) with comments: LAPAROSCOPIC COLOSTOMY TAKEDOWN (N/A) - Laparoscopic Assisted Colostomy Closure as a surgical intervention .  The patient's history has been reviewed, patient examined, no change in status, stable for surgery.  I have reviewed the patient's chart and labs.  Questions were answered to the patient's satisfaction.     [name] [name]

## 2012-06-11 NOTE — Anesthesia Preprocedure Evaluation (Addendum)
Anesthesia Evaluation  Patient identified by MRN, date of birth, ID band Patient awake    Reviewed: Allergy & Precautions, H&P , NPO status , Patient's Chart, lab work & pertinent test results  Airway Mallampati: II TM Distance: >3 FB Neck ROM: full    Dental  (+) Caps and Dental Advisory Given All front teeth are capped:   Pulmonary Current Smoker,  breath sounds clear to auscultation  Pulmonary exam normal       Cardiovascular Exercise Tolerance: Good negative cardio ROS  Rhythm:regular Rate:Normal     Neuro/Psych negative neurological ROS  negative psych ROS   GI/Hepatic negative GI ROS, Neg liver ROS,   Endo/Other  negative endocrine ROS  Renal/GU negative Renal ROS  negative genitourinary   Musculoskeletal   Abdominal   Peds  Hematology negative hematology ROS (+)   Anesthesia Other Findings   Reproductive/Obstetrics negative OB ROS                          Anesthesia Physical Anesthesia Plan  ASA: II  Anesthesia Plan: General   Post-op Pain Management:    Induction: Intravenous  Airway Management Planned: Oral ETT  Additional Equipment:   Intra-op Plan:   Post-operative Plan: Extubation in OR  Informed Consent: I have reviewed the patients History and Physical, chart, labs and discussed the procedure including the risks, benefits and alternatives for the proposed anesthesia with the patient or authorized representative who has indicated his/her understanding and acceptance.   Dental Advisory Given  Plan Discussed with: CRNA and Surgeon  Anesthesia Plan Comments:         Anesthesia Quick Evaluation

## 2012-06-12 ENCOUNTER — Encounter (HOSPITAL_COMMUNITY): Payer: Self-pay | Admitting: Registered Nurse

## 2012-06-12 ENCOUNTER — Encounter (HOSPITAL_COMMUNITY): Payer: Self-pay | Admitting: General Surgery

## 2012-06-12 ENCOUNTER — Inpatient Hospital Stay (HOSPITAL_COMMUNITY): Payer: 59

## 2012-06-12 DIAGNOSIS — Z933 Colostomy status: Secondary | ICD-10-CM

## 2012-06-12 LAB — BASIC METABOLIC PANEL
BUN: 10 mg/dL (ref 6–23)
Calcium: 8.8 mg/dL (ref 8.4–10.5)
Creatinine, Ser: 0.76 mg/dL (ref 0.50–1.35)
GFR calc Af Amer: >90 mL/min (ref >90)
GFR calc non Af Amer: >90 mL/min (ref >90)

## 2012-06-12 LAB — CBC
MCH: 31.1 pg (ref 26.0–34.0)
MCHC: 34.1 g/dL (ref 30.0–36.0)
RDW: 12.9 % (ref 11.5–15.5)

## 2012-06-12 MED ORDER — KETOROLAC TROMETHAMINE 30 MG/ML IJ SOLN
30.0000 mg | Freq: Four times a day (QID) | INTRAMUSCULAR | Status: AC
  Administered 2012-06-12 – 2012-06-14 (×8): 30 mg via INTRAVENOUS
  Filled 2012-06-12 (×10): qty 1

## 2012-06-12 MED ORDER — PROMETHAZINE HCL 25 MG/ML IJ SOLN
12.5000 mg | INTRAMUSCULAR | Status: DC | PRN
  Administered 2012-06-12 – 2012-06-14 (×5): 12.5 mg via INTRAVENOUS
  Filled 2012-06-12 (×5): qty 1

## 2012-06-12 MED ORDER — ONDANSETRON HCL 4 MG/2ML IJ SOLN: 4.0000 mg | Freq: Once | INTRAMUSCULAR | Status: DC

## 2012-06-12 NOTE — Progress Notes (Signed)
Dr. Abbey Chatters notified regarding patient loss of a second iv access. Orders were received to initiate PICC line access if unable to restart another peripheral line.  Francene Finders, RN

## 2012-06-12 NOTE — Care Management Note (Signed)
    Page 1 of 1   06/12/2012     11:07:55 AM   CARE MANAGEMENT NOTE 06/12/2012  Patient:  Philip Jimenez, Philip Jimenez   Account Number:  [id number]  Date Initiated:  06/12/2012  Documentation initiated by:  Lorenda Ishihara  Subjective/Objective Assessment:   52 yo male admitted s/p lap colectomy and colostomy closure. PTA lived at home spouse.     Action/Plan:   Home when stable   Anticipated DC Date:  06/15/2012   Anticipated DC Plan:  HOME/SELF CARE      DC Planning Services  CM consult      Choice offered to / List presented to:             Status of service:  Completed, signed off Medicare Important Message given?  NO (If response is "NO", the following Medicare IM given date fields will be blank) Date Medicare IM given:   Date Additional Medicare IM given:    Discharge Disposition:  HOME/SELF CARE  Per UR Regulation:  Reviewed for med. necessity/level of care/duration of stay  If discussed at Long Length of Stay Meetings, dates discussed:    Comments:

## 2012-06-12 NOTE — Progress Notes (Addendum)
1 Day Post-Op  Subjective: Sore in LUQ area.  Some nausea.  Objective: Vital signs in last 24 hours: Temp:  [97.3 F (36.3 C)-98.7 F (37.1 C)] 97.9 F (36.6 C) (01/17 0607) Pulse Rate:  [51-99] 77  (01/17 0607) Resp:  [9-18] 16  (01/17 0607) BP: (95-132)/(62-88) 109/70 mmHg (01/17 0607) SpO2:  [97 %-100 %] 100 % (01/17 0607) Weight:  [180 lb (81.647 kg)] 180 lb (81.647 kg) (01/16 1549) Last BM Date: 06/11/12  Intake/Output from previous day: 01/16 0701 - 01/17 0700 In: 4275 [I.V.:4275] Out: 1360 [Urine:945; Drains:90; Blood:300] Intake/Output this shift:    PE: General- In NAD Abdomen-soft, dressings dry, hypoactive bowel sounds, serosanguinous drain output  Lab Results:   Basename 06/12/12 0435  WBC 11.3*  HGB 14.0  HCT 41.0  PLT 210   BMET  Basename 06/12/12 0435  NA 140  K 4.8  CL 104  CO2 28  GLUCOSE 139*  BUN 10  CREATININE 0.76  CALCIUM 8.8   PT/INR No results found for this basename: LABPROT:2,INR:2 in the last 72 hours Comprehensive Metabolic Panel:    Component Value Date/Time   NA 140 06/12/2012 0435   K 4.8 06/12/2012 0435   CL 104 06/12/2012 0435   CO2 28 06/12/2012 0435   BUN 10 06/12/2012 0435   CREATININE 0.76 06/12/2012 0435   GLUCOSE 139* 06/12/2012 0435   CALCIUM 8.8 06/12/2012 0435   AST 18 06/02/2012 0930   ALT 21 06/02/2012 0930   ALKPHOS 91 06/02/2012 0930   BILITOT 0.2* 06/02/2012 0930   PROT 7.5 06/02/2012 0930   ALBUMIN 4.0 06/02/2012 0930     Studies/Results: No results found.  Anti-infectives: Anti-infectives     Start     Dose/Rate Route Frequency Ordered Stop   06/11/12 1600   cefOXitin (MEFOXIN) 1 g in dextrose 5 % 50 mL IVPB        1 g 100 mL/hr over 30 Minutes Intravenous Every 6 hours 06/11/12 1414 06/12/12 0430   06/11/12 0700   cefOXitin (MEFOXIN) 2 g in dextrose 5 % 50 mL IVPB        2 g 100 mL/hr over 30 Minutes Intravenous On call to O.R. 06/11/12 0700 06/11/12 0932          Assessment Active Problems:  s/p  laparoscopic colostomy closure 06/11/12-stable overnight course    LOS: 1 day   Plan: Mobilize.  Sips of water and ice chips.  Add Toradol.   Jourdan Durbin J 06/12/2012

## 2012-06-12 NOTE — Procedures (Signed)
Successful placement of right basilic approach 40 cm dual lumen PICC line with tip at the superior caval-atrial junction.  The PICC line is ready for immediate use.

## 2012-06-13 LAB — BASIC METABOLIC PANEL
CO2: 29 mEq/L (ref 19–32)
Calcium: 8.6 mg/dL (ref 8.4–10.5)
Chloride: 101 mEq/L (ref 96–112)
GFR calc Af Amer: >90 mL/min (ref >90)
Sodium: 137 mEq/L (ref 135–145)

## 2012-06-13 LAB — CBC
Platelets: 189 10*3/uL (ref 150–400)
RBC: 3.91 MIL/uL — ABNORMAL LOW (ref 4.22–5.81)
RDW: 12.5 % (ref 11.5–15.5)
WBC: 12 10*3/uL — ABNORMAL HIGH (ref 4.0–10.5)

## 2012-06-13 MED ORDER — LORAZEPAM 0.5 MG PO TABS
0.5000 mg | ORAL_TABLET | Freq: Every evening | ORAL | Status: DC | PRN
  Administered 2012-06-13: 0.5 mg via ORAL
  Filled 2012-06-13: qty 1

## 2012-06-13 MED ORDER — DIPHENHYDRAMINE HCL 25 MG PO CAPS: 25.0000 mg | ORAL_CAPSULE | Freq: Every evening | ORAL | Status: DC | PRN

## 2012-06-13 MED ORDER — PANTOPRAZOLE SODIUM 40 MG PO TBEC
40.0000 mg | DELAYED_RELEASE_TABLET | Freq: Two times a day (BID) | ORAL | Status: DC
  Administered 2012-06-13 – 2012-06-15 (×5): 40 mg via ORAL
  Filled 2012-06-13 (×6): qty 1

## 2012-06-13 NOTE — Progress Notes (Signed)
2 Days Post-Op  Subjective: Sore in LUQ area.  Some nausea yesterday and this morning.  Objective: Vital signs in last 24 hours: Temp:  [98.2 F (36.8 C)-99.4 F (37.4 C)] 99.4 F (37.4 C) (01/18 0541) Pulse Rate:  [54-96] 73  (01/18 0541) Resp:  [13-18] 18  (01/18 0541) BP: (119-156)/(77-96) 155/96 mmHg (01/18 0541) SpO2:  [92 %-100 %] 95 % (01/18 0541) Last BM Date: 06/11/12  Intake/Output from previous day: 01/17 0701 - 01/18 0700 In: 4127.1 [I.V.:4127.1] Out: 1060 [Urine:1000; Emesis/NG output:30; Drains:30] Intake/Output this shift: Total I/O In: 456.3 [I.V.:456.3] Out: 200 [Urine:200]  PE: General- In NAD Abdomen-soft, dressings dry, hypoactive bowel sounds, serosanguinous drain output  Lab Results:   Basename 06/12/12 0435  WBC 11.3*  HGB 14.0  HCT 41.0  PLT 210   BMET  Basename 06/12/12 0435  NA 140  K 4.8  CL 104  CO2 28  GLUCOSE 139*  BUN 10  CREATININE 0.76  CALCIUM 8.8   PT/INR No results found for this basename: LABPROT:2,INR:2 in the last 72 hours Comprehensive Metabolic Panel:    Component Value Date/Time   NA 140 06/12/2012 0435   K 4.8 06/12/2012 0435   CL 104 06/12/2012 0435   CO2 28 06/12/2012 0435   BUN 10 06/12/2012 0435   CREATININE 0.76 06/12/2012 0435   GLUCOSE 139* 06/12/2012 0435   CALCIUM 8.8 06/12/2012 0435   AST 18 06/02/2012 0930   ALT 21 06/02/2012 0930   ALKPHOS 91 06/02/2012 0930   BILITOT 0.2* 06/02/2012 0930   PROT 7.5 06/02/2012 0930   ALBUMIN 4.0 06/02/2012 0930     Studies/Results: Ir Fluoro Guide Cv Line Right  06/12/2012  *RADIOLOGY REPORT*  Indication: Poor venous access  ULTRASOUND AND FLUORSCOPIC GUIDED PICC LINE INSERTION  Intravenous Medications: None  Contrast: None  Fluoroscopy Time:  0.5 minutes.  Complications: None immediate  Technique / Findings:  The procedure, risks, benefits, and alternatives were explained to the patient and informed written consent was obtained.  A timeout was performed prior to the  initiation of the procedure.  The  upper extremity was prepped with chlorhexidine in a sterile fashion, and a sterile drape was applied covering the operative field.  Maximum barrier sterile technique with sterile gowns and gloves were used for the procedure.  A timeout was performed prior to the initiation of the procedure.  Local anesthesia was provided with 1% lidocaine.  Under direct ultrasound guidance, the rightbasilicvein was accessed with a micropuncture kit after the overlying soft tissues were anesthetized with 1% lidocaine.  An ultrasound image was saved for documentation purposes.  A guidewire was advanced to the level of the superior caval-atrial junction for measurement purposes and the PICC line was cut to length.  A peel-away sheath was placed and a 40 cm, 5 [place], dual lumen was inserted to level of the superior caval-atrial junction.  A post procedure spot fluoroscopic was obtained.  The catheter easily aspirated and flushed and was sutured in place.  A dressing was placed.  The patient tolerated the procedure well without immediate post procedural complication.  Impression:  Successful ultrasound and fluoroscopic guided placement of a right basilic vein approach, 40 cm, 5 French,dual lumen PICC with tip at the superior caval-atrial junction.  The PICC line is ready for immediate use.   Original Report Authenticated By: Tacey Ruiz, MD    Ir US Guide Vasc Access Right  06/12/2012  *RADIOLOGY REPORT*  Indication: Poor venous access  ULTRASOUND AND  FLUORSCOPIC GUIDED PICC LINE INSERTION  Intravenous Medications: None  Contrast: None  Fluoroscopy Time:  0.5 minutes.  Complications: None immediate  Technique / Findings:  The procedure, risks, benefits, and alternatives were explained to the patient and informed written consent was obtained.  A timeout was performed prior to the initiation of the procedure.  The  upper extremity was prepped with chlorhexidine in a sterile fashion, and a sterile  drape was applied covering the operative field.  Maximum barrier sterile technique with sterile gowns and gloves were used for the procedure.  A timeout was performed prior to the initiation of the procedure.  Local anesthesia was provided with 1% lidocaine.  Under direct ultrasound guidance, the rightbasilicvein was accessed with a micropuncture kit after the overlying soft tissues were anesthetized with 1% lidocaine.  An ultrasound image was saved for documentation purposes.  A guidewire was advanced to the level of the superior caval-atrial junction for measurement purposes and the PICC line was cut to length.  A peel-away sheath was placed and a 40 cm, 5 [place], dual lumen was inserted to level of the superior caval-atrial junction.  A post procedure spot fluoroscopic was obtained.  The catheter easily aspirated and flushed and was sutured in place.  A dressing was placed.  The patient tolerated the procedure well without immediate post procedural complication.  Impression:  Successful ultrasound and fluoroscopic guided placement of a right basilic vein approach, 40 cm, 5 French,dual lumen PICC with tip at the superior caval-atrial junction.  The PICC line is ready for immediate use.   Original Report Authenticated By: Tacey Ruiz, MD     Anti-infectives: Anti-infectives     Start     Dose/Rate Route Frequency Ordered Stop   06/11/12 1600   cefOXitin (MEFOXIN) 1 g in dextrose 5 % 50 mL IVPB        1 g 100 mL/hr over 30 Minutes Intravenous Every 6 hours 06/11/12 1414 06/12/12 0430   06/11/12 0700   cefOXitin (MEFOXIN) 2 g in dextrose 5 % 50 mL IVPB        2 g 100 mL/hr over 30 Minutes Intravenous On call to O.R. 06/11/12 0700 06/11/12 0932          Assessment Active Problems:  s/p laparoscopic colostomy closure 06/11/12-stable course    LOS: 2 days   Plan: Mobilize.  Sips of water and ice chips.  Cont Toradol.  Will check labs today.  PPI while NPO.  RN to change dressings.   Jayshun Galentine,  Leveta Wahab C. 06/13/2012

## 2012-06-14 MED ORDER — SODIUM CHLORIDE 0.9 % IJ SOLN
10.0000 mL | INTRAMUSCULAR | Status: DC | PRN
  Administered 2012-06-14 – 2012-06-15 (×2): 10 mL

## 2012-06-14 MED ORDER — ACETAMINOPHEN 325 MG PO TABS
650.0000 mg | ORAL_TABLET | Freq: Four times a day (QID) | ORAL | Status: DC | PRN
  Administered 2012-06-14: 650 mg via ORAL
  Filled 2012-06-14: qty 2

## 2012-06-14 MED ORDER — OXYCODONE-ACETAMINOPHEN 5-325 MG PO TABS
1.0000 | ORAL_TABLET | ORAL | Status: DC | PRN
  Administered 2012-06-15: 2 via ORAL
  Filled 2012-06-14: qty 2

## 2012-06-14 MED ORDER — HYDROMORPHONE HCL PF 1 MG/ML IJ SOLN
0.5000 mg | INTRAMUSCULAR | Status: DC | PRN
  Administered 2012-06-14 (×3): 0.5 mg via INTRAVENOUS
  Filled 2012-06-14 (×3): qty 1

## 2012-06-14 NOTE — Progress Notes (Signed)
3 Days Post-Op  Subjective: Feels much better today, ambulating in the hall, pos flatus, no BM yet  Objective: Vital signs in last 24 hours: Temp:  [98.4 F (36.9 C)-98.8 F (37.1 C)] 98.7 F (37.1 C) (01/19 0610) Pulse Rate:  [76-80] 76  (01/19 0610) Resp:  [16-18] 18  (01/19 0727) BP: (125-149)/(77-95) 125/80 mmHg (01/19 0610) SpO2:  [94 %-100 %] 96 % (01/19 0727) Last BM Date: 06/13/12  Intake/Output from previous day: 01/18 0701 - 01/19 0700 In: 2956.3 [I.V.:2956.3] Out: 445 [Urine:400; Drains:45] Intake/Output this shift:    PE: General- In NAD Abdomen-soft, dressings dry, hypoactive bowel sounds, serosanguinous drain output  Lab Results:   Basename 06/13/12 1557 06/12/12 0435  WBC 12.0* 11.3*  HGB 12.0* 14.0  HCT 35.8* 41.0  PLT 189 210   BMET  Basename 06/13/12 1557 06/12/12 0435  NA 137 140  K 3.8 4.8  CL 101 104  CO2 29 28  GLUCOSE 122* 139*  BUN 9 10  CREATININE 0.82 0.76  CALCIUM 8.6 8.8   PT/INR No results found for this basename: LABPROT:2,INR:2 in the last 72 hours Comprehensive Metabolic Panel:    Component Value Date/Time   NA 137 06/13/2012 1557   K 3.8 06/13/2012 1557   CL 101 06/13/2012 1557   CO2 29 06/13/2012 1557   BUN 9 06/13/2012 1557   CREATININE 0.82 06/13/2012 1557   GLUCOSE 122* 06/13/2012 1557   CALCIUM 8.6 06/13/2012 1557   AST 18 06/02/2012 0930   ALT 21 06/02/2012 0930   ALKPHOS 91 06/02/2012 0930   BILITOT 0.2* 06/02/2012 0930   PROT 7.5 06/02/2012 0930   ALBUMIN 4.0 06/02/2012 0930     Studies/Results: Ir Fluoro Guide Cv Line Right  06/12/2012  *RADIOLOGY REPORT*  Indication: Poor venous access  ULTRASOUND AND FLUORSCOPIC GUIDED PICC LINE INSERTION  Intravenous Medications: None  Contrast: None  Fluoroscopy Time:  0.5 minutes.  Complications: None immediate  Technique / Findings:  The procedure, risks, benefits, and alternatives were explained to the patient and informed written consent was obtained.  A timeout was performed prior  to the initiation of the procedure.  The  upper extremity was prepped with chlorhexidine in a sterile fashion, and a sterile drape was applied covering the operative field.  Maximum barrier sterile technique with sterile gowns and gloves were used for the procedure.  A timeout was performed prior to the initiation of the procedure.  Local anesthesia was provided with 1% lidocaine.  Under direct ultrasound guidance, the rightbasilicvein was accessed with a micropuncture kit after the overlying soft tissues were anesthetized with 1% lidocaine.  An ultrasound image was saved for documentation purposes.  A guidewire was advanced to the level of the superior caval-atrial junction for measurement purposes and the PICC line was cut to length.  A peel-away sheath was placed and a 40 cm, 5 [place], dual lumen was inserted to level of the superior caval-atrial junction.  A post procedure spot fluoroscopic was obtained.  The catheter easily aspirated and flushed and was sutured in place.  A dressing was placed.  The patient tolerated the procedure well without immediate post procedural complication.  Impression:  Successful ultrasound and fluoroscopic guided placement of a right basilic vein approach, 40 cm, 5 French,dual lumen PICC with tip at the superior caval-atrial junction.  The PICC line is ready for immediate use.   Original Report Authenticated By: Tacey Ruiz, MD    Ir US Guide Vasc Access Right  06/12/2012  *  RADIOLOGY REPORT*  Indication: Poor venous access  ULTRASOUND AND FLUORSCOPIC GUIDED PICC LINE INSERTION  Intravenous Medications: None  Contrast: None  Fluoroscopy Time:  0.5 minutes.  Complications: None immediate  Technique / Findings:  The procedure, risks, benefits, and alternatives were explained to the patient and informed written consent was obtained.  A timeout was performed prior to the initiation of the procedure.  The  upper extremity was prepped with chlorhexidine in a sterile fashion, and a  sterile drape was applied covering the operative field.  Maximum barrier sterile technique with sterile gowns and gloves were used for the procedure.  A timeout was performed prior to the initiation of the procedure.  Local anesthesia was provided with 1% lidocaine.  Under direct ultrasound guidance, the rightbasilicvein was accessed with a micropuncture kit after the overlying soft tissues were anesthetized with 1% lidocaine.  An ultrasound image was saved for documentation purposes.  A guidewire was advanced to the level of the superior caval-atrial junction for measurement purposes and the PICC line was cut to length.  A peel-away sheath was placed and a 40 cm, 5 [place], dual lumen was inserted to level of the superior caval-atrial junction.  A post procedure spot fluoroscopic was obtained.  The catheter easily aspirated and flushed and was sutured in place.  A dressing was placed.  The patient tolerated the procedure well without immediate post procedural complication.  Impression:  Successful ultrasound and fluoroscopic guided placement of a right basilic vein approach, 40 cm, 5 French,dual lumen PICC with tip at the superior caval-atrial junction.  The PICC line is ready for immediate use.   Original Report Authenticated By: Tacey Ruiz, MD     Anti-infectives: Anti-infectives     Start     Dose/Rate Route Frequency Ordered Stop   06/11/12 1600   cefOXitin (MEFOXIN) 1 g in dextrose 5 % 50 mL IVPB        1 g 100 mL/hr over 30 Minutes Intravenous Every 6 hours 06/11/12 1414 06/12/12 0430   06/11/12 0700   cefOXitin (MEFOXIN) 2 g in dextrose 5 % 50 mL IVPB        2 g 100 mL/hr over 30 Minutes Intravenous On call to O.R. 06/11/12 0700 06/11/12 0932          Assessment Active Problems:  s/p laparoscopic colostomy closure 06/11/12-stable course    LOS: 3 days   Plan: Mobilize.  Will start clears today and advance to fulls if tolerated.  PO pain meds.  Cont ambulation and inc spirometry.   Will recheck CBC in AM.  Harshil Cavallaro C. 06/14/2012

## 2012-06-15 LAB — CBC
HCT: 34.6 % — ABNORMAL LOW (ref 39.0–52.0)
MCV: 89.9 fL (ref 78.0–100.0)
Platelets: 169 10*3/uL (ref 150–400)
RBC: 3.85 MIL/uL — ABNORMAL LOW (ref 4.22–5.81)
RDW: 12.6 % (ref 11.5–15.5)
WBC: 7.1 10*3/uL (ref 4.0–10.5)

## 2012-06-15 MED ORDER — OXYCODONE-ACETAMINOPHEN 5-325 MG PO TABS: 1.0000 | ORAL_TABLET | ORAL | Status: DC | PRN

## 2012-06-15 MED ORDER — DOCUSATE SODIUM 100 MG PO CAPS
100.0000 mg | ORAL_CAPSULE | Freq: Two times a day (BID) | ORAL | Status: DC
  Administered 2012-06-15: 100 mg via ORAL
  Filled 2012-06-15 (×2): qty 1

## 2012-06-15 MED ORDER — DSS 100 MG PO CAPS: 100.0000 mg | ORAL_CAPSULE | Freq: Two times a day (BID) | ORAL | Status: DC

## 2012-06-15 NOTE — Discharge Summary (Signed)
Physician Discharge Summary  Patient ID: DONAVIN AUDINO MRN: [id number] DOB/AGE: 1960-08-13 52 y.o.  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 He initially had some significant soreness in the left upper quadrant region but this improved. The colostomy site subcutaneous tissue was left open and wet-to-dry dressing changes were started.  As his postoperative nausea improved and he began to pass gas his diet was advanced. By his fourth postoperative day he was tolerating a regular diet, his bowels were moving, and his pain was well-controlled. He was able to be discharged. He was given discharge instructions and medication for pain.  Consults: None  Significant Diagnostic Studies: none  Treatments: surgery: Laparoscopic-assisted colostomy closure 06/11/2012  Discharge Exam: Blood pressure 139/84, pulse 57, temperature 97.6 F (36.4 C), temperature source Oral, resp. rate 18, height 5\' 10"  (1.778 m), weight 180 lb (81.647 kg), SpO2 97.00%.   Disposition: 01-Home or Self Care  Discharge Orders    Future Appointments: Provider: Department: Dept Phone: Center:   06/29/2012 9:00 AM Adolph Pollack, MD [facility], [place] [phone] None       Medication List     As of 06/15/2012  4:11 PM    TAKE these medications         acetaminophen 500 MG tablet   Commonly known as: TYLENOL   Take 1,000 mg by mouth every 6 (six) hours as needed. For pain      DSS 100 MG Caps   Take 100 mg by mouth 2 (two) times daily.      oxyCODONE-acetaminophen 5-325 MG per tablet   Commonly known as: PERCOCET/ROXICET   Take 1-2 tablets by mouth every 4 (four) hours as needed.           Follow-up Information    Follow up with Yuval Rubens  J, MD. In 2 weeks.   Contact information:   [address] [place] [address] [phone]          Signed: Adolph Pollack 06/15/2012, 4:11 PM

## 2012-06-15 NOTE — Progress Notes (Signed)
4 Days Post-Op  Subjective: Doing well, ambulating in the hall, pos flatus, no BM yet  Objective: Vital signs in last 24 hours: Temp:  [97.6 F (36.4 C)-98.6 F (37 C)] 97.6 F (36.4 C) (01/20 0541) Pulse Rate:  [57-73] 57  (01/20 0541) Resp:  [18] 18  (01/20 0541) BP: (123-139)/(73-84) 139/84 mmHg (01/20 0541) SpO2:  [94 %-98 %] 97 % (01/20 0541) Last BM Date: 06/13/12  Intake/Output from previous day: 01/19 0701 - 01/20 0700 In: 2272.5 [P.O.:240; I.V.:2032.5] Out: 111 [Urine:1; Drains:110] Intake/Output this shift: Total I/O In: 142.5 [I.V.:142.5] Out: -   PE: General- In NAD Abdomen-soft, dressings dry, hypoactive bowel sounds, serosanguinous drain output  Lab Results:   Basename 06/15/12 0415 06/13/12 1557  WBC 7.1 12.0*  HGB 11.8* 12.0*  HCT 34.6* 35.8*  PLT 169 189   BMET  Basename 06/13/12 1557  NA 137  K 3.8  CL 101  CO2 29  GLUCOSE 122*  BUN 9  CREATININE 0.82  CALCIUM 8.6   PT/INR No results found for this basename: LABPROT:2,INR:2 in the last 72 hours Comprehensive Metabolic Panel:    Component Value Date/Time   NA 137 06/13/2012 1557   K 3.8 06/13/2012 1557   CL 101 06/13/2012 1557   CO2 29 06/13/2012 1557   BUN 9 06/13/2012 1557   CREATININE 0.82 06/13/2012 1557   GLUCOSE 122* 06/13/2012 1557   CALCIUM 8.6 06/13/2012 1557   AST 18 06/02/2012 0930   ALT 21 06/02/2012 0930   ALKPHOS 91 06/02/2012 0930   BILITOT 0.2* 06/02/2012 0930   PROT 7.5 06/02/2012 0930   ALBUMIN 4.0 06/02/2012 0930     Studies/Results: No results found.  Anti-infectives: Anti-infectives     Start     Dose/Rate Route Frequency Ordered Stop   06/11/12 1600   cefOXitin (MEFOXIN) 1 g in dextrose 5 % 50 mL IVPB        1 g 100 mL/hr over 30 Minutes Intravenous Every 6 hours 06/11/12 1414 06/12/12 0430   06/11/12 0700   cefOXitin (MEFOXIN) 2 g in dextrose 5 % 50 mL IVPB        2 g 100 mL/hr over 30 Minutes Intravenous On call to O.R. 06/11/12 0700 06/11/12 0932           Assessment Active Problems:  s/p laparoscopic colostomy closure 06/11/12-stable course    LOS: 4 days   Plan: Doing well.  Low fiber diet today, saline lock IV.  IF BM today, could go home.  Philip Jimenez C. 06/15/2012

## 2012-06-24 ENCOUNTER — Telehealth (INDEPENDENT_AMBULATORY_CARE_PROVIDER_SITE_OTHER): Payer: Self-pay | Admitting: General Surgery

## 2012-06-24 NOTE — Telephone Encounter (Signed)
Pt called for pain med refill.  Called in Hydrocodone 5/325 mg, # 30, 1-2 po Q4-6H prn pain, no refill; phoned to [facility]:  [phone].

## 2012-06-25 ENCOUNTER — Encounter (HOSPITAL_COMMUNITY): Payer: Self-pay

## 2012-06-25 ENCOUNTER — Emergency Department (HOSPITAL_COMMUNITY): Payer: 59

## 2012-06-25 ENCOUNTER — Telehealth (INDEPENDENT_AMBULATORY_CARE_PROVIDER_SITE_OTHER): Payer: Self-pay | Admitting: General Surgery

## 2012-06-25 ENCOUNTER — Emergency Department (HOSPITAL_COMMUNITY)
Admission: EM | Admit: 2012-06-25 | Discharge: 2012-06-25 | Disposition: A | Discharge status: Home / Self Care | Payer: 59 | Attending: Emergency Medicine | Admitting: Emergency Medicine

## 2012-06-25 DIAGNOSIS — Y838 Other surgical procedures as the cause of abnormal reaction of the patient, or of later complication, without mention of misadventure at the time of the procedure: Secondary | ICD-10-CM | POA: Insufficient documentation

## 2012-06-25 DIAGNOSIS — IMO0002 Reserved for concepts with insufficient information to code with codable children: Secondary | ICD-10-CM | POA: Insufficient documentation

## 2012-06-25 DIAGNOSIS — R6883 Chills (without fever): Secondary | ICD-10-CM | POA: Insufficient documentation

## 2012-06-25 DIAGNOSIS — R5381 Other malaise: Secondary | ICD-10-CM | POA: Insufficient documentation

## 2012-06-25 DIAGNOSIS — Z8719 Personal history of other diseases of the digestive system: Secondary | ICD-10-CM | POA: Insufficient documentation

## 2012-06-25 DIAGNOSIS — R531 Weakness: Secondary | ICD-10-CM

## 2012-06-25 DIAGNOSIS — Z87891 Personal history of nicotine dependence: Secondary | ICD-10-CM | POA: Insufficient documentation

## 2012-06-25 LAB — CBC WITH DIFFERENTIAL/PLATELET
Basophils Absolute: 0 10*3/uL (ref 0.0–0.1)
Basophils Relative: 0 % (ref 0–1)
Eosinophils Absolute: 0.2 10*3/uL (ref 0.0–0.7)
Lymphs Abs: 3.1 10*3/uL (ref 0.7–4.0)
MCH: 31.2 pg (ref 26.0–34.0)
Neutrophils Relative %: 73 % (ref 43–77)
Platelets: 402 10*3/uL — ABNORMAL HIGH (ref 150–400)
RBC: 4.9 MIL/uL (ref 4.22–5.81)
RDW: 13.2 % (ref 11.5–15.5)

## 2012-06-25 LAB — COMPREHENSIVE METABOLIC PANEL
ALT: 32 U/L (ref 0–53)
AST: 27 U/L (ref 0–37)
Albumin: 3.7 g/dL (ref 3.5–5.2)
Alkaline Phosphatase: 149 U/L — ABNORMAL HIGH (ref 39–117)
Calcium: 9.9 mg/dL (ref 8.4–10.5)
Glucose, Bld: 92 mg/dL (ref 70–99)
Potassium: 5.2 mEq/L — ABNORMAL HIGH (ref 3.5–5.1)
Sodium: 135 mEq/L (ref 135–145)
Total Protein: 7.7 g/dL (ref 6.0–8.3)

## 2012-06-25 LAB — POTASSIUM: Potassium: 3.9 mEq/L (ref 3.5–5.1)

## 2012-06-25 LAB — URINALYSIS, ROUTINE W REFLEX MICROSCOPIC
Bilirubin Urine: NEGATIVE
Glucose, UA: NEGATIVE mg/dL
Ketones, ur: 15 mg/dL — AB
Specific Gravity, Urine: >1.046 — ABNORMAL HIGH (ref 1.005–1.030)
pH: 7 (ref 5.0–8.0)

## 2012-06-25 MED ORDER — IOHEXOL 300 MG/ML  SOLN
100.0000 mL | Freq: Once | INTRAMUSCULAR | Status: AC | PRN
  Administered 2012-06-25: 100 mL via INTRAVENOUS

## 2012-06-25 MED ORDER — IOHEXOL 300 MG/ML  SOLN: 50.0000 mL | Freq: Once | INTRAMUSCULAR | Status: DC | PRN

## 2012-06-25 MED ORDER — LACTATED RINGERS IV BOLUS (SEPSIS): 1000.0000 mL | Freq: Once | INTRAVENOUS | Status: DC

## 2012-06-25 MED ORDER — LACTATED RINGERS IV BOLUS (SEPSIS)
1000.0000 mL | Freq: Once | INTRAVENOUS | Status: AC
  Administered 2012-06-25: 1000 mL via INTRAVENOUS

## 2012-06-25 NOTE — ED Provider Notes (Signed)
History     CSN: [id number]  Arrival date & time 06/25/12  1339   First MD Initiated Contact with Patient 06/25/12 1601      Chief Complaint  Patient presents with  . Weakness  . Post-op Problem    (Consider location/radiation/quality/duration/timing/severity/associated sxs/prior treatment) HPI  Patient s/p colostomy take down on January 16. Patient discharged 5 days later and feels like he has had done well immediately, then had decreased energy beginning January 23.  With decreasing energy since.  He describes this as decreased exercise capacity, increase sleep.  Taking po well, eating and drinking, bowel movements are normal, urine normal.  Denies chest pain, cough, abdominal pain, or fever.  Patient states he has had some chills today.  Taking percocet as needed for pain but is down to about 3 per day.   Past Medical History  Diagnosis Date  . Tobacco use 02/08/2012    Less than 1ppd for 36 years  . Hx of gastric ulcer 02/08/2012  . Diverticulosis 02/08/2012    With diverticulitis DEC 2012. Colonoscopy Jan 2013 DR. Mann  . Diverticulitis with perforation 02/08/2012  . GERD (gastroesophageal reflux disease)     Past Surgical History  Procedure Date  . Colon resection 02/14/2012    Procedure: COLON RESECTION;  Surgeon: Adolph Pollack, MD;  Location: [facility] OR;  Service: General;  Laterality: N/A;  . Colostomy takedown 06/11/2012    Procedure: LAPAROSCOPIC COLOSTOMY TAKEDOWN;  Surgeon: Adolph Pollack, MD;  Location: [facility] ORS;  Service: General;  Laterality: N/A;  Laparoscopic Assisted Colostomy Closure  . Colon resection 06/11/2012    Procedure: COLON RESECTION LAPAROSCOPIC;  Surgeon: Adolph Pollack, MD;  Location: [facility] ORS;  Service: General;;    Family History  Problem Relation Age of Onset  . Cancer Brother     colon, lung    History  Substance Use Topics  . Smoking status: Former Smoker    Types: Cigarettes  . Smokeless tobacco: Never Used     Comment: 4-5  cigarettes month  . Alcohol Use: No      Review of Systems  Constitutional: Positive for chills.  HENT: Negative.   Eyes: Negative.   Respiratory: Negative.   Cardiovascular: Negative.   Gastrointestinal: Negative.   Genitourinary: Negative.   Musculoskeletal: Negative.   Skin: Negative.   Neurological: Negative.   Hematological: Negative.   Psychiatric/Behavioral: Negative.     Allergies  Penicillins  Home Medications   Current Outpatient Rx  Name  Route  Sig  Dispense  Refill  . DSS 100 MG PO CAPS   Oral   Take 100 mg by mouth 2 (two) times daily.   30 capsule   1   . OXYCODONE-ACETAMINOPHEN 5-325 MG PO TABS   Oral   Take 1-2 tablets by mouth every 4 (four) hours as needed. Pain.           BP 120/83  Pulse 78  Temp 98.8 F (37.1 C) (Oral)  Resp 20  SpO2 100%  Physical Exam  Nursing note and vitals reviewed. Constitutional: He is oriented to person, place, and time. He appears well-developed and well-nourished.  HENT:  Head: Normocephalic and atraumatic.  Right Ear: External ear normal.  Left Ear: External ear normal.  Nose: Nose normal.  Mouth/Throat: Oropharynx is clear and moist.  Eyes: Conjunctivae normal and EOM are normal. Pupils are equal, round, and reactive to light.  Neck: Normal range of motion. Neck supple.  Cardiovascular: Normal rate, regular rhythm, normal  heart sounds and intact distal pulses.   Pulmonary/Chest: Effort normal and breath sounds normal.  Abdominal: Soft. Bowel sounds are normal.       Dressing in place left lower abdomen, Dr. Abbey Chatters had changed dressing and viewed pte.   Musculoskeletal: Normal range of motion.  Neurological: He is alert and oriented to person, place, and time. He has normal reflexes.  Skin: Skin is warm and dry.  Psychiatric: He has a normal mood and affect. His behavior is normal. Judgment and thought content normal.    ED Course  Procedures (including critical care time)  Labs Reviewed   CBC WITH DIFFERENTIAL - Abnormal; Notable for the following:    WBC 15.8 (*)     Platelets 402 (*)     Neutro Abs 11.5 (*)     All other components within normal limits  COMPREHENSIVE METABOLIC PANEL   No results found.   No diagnosis found.    MDM  Patient postop colostomy take down with generalized weakness.  Patient with normal labs (repeat potassium normal) except elevated wbc.  Patient given iv fluids and advised.  He will follow up with his surgeon outpatient.    Patient's wife returned to take patient home and voices anger that patient is being discharged.  Discussed with patient, wife, and unknown woman (familiar to patient and wife) in room that patient does not have any indications for hospitalization at this time.  Wife states "he is no better of than when he came in and you're not giving him any antibiotics or nothing."  Discussed with patient and wife no indication for antibiotics and antibiotic resistance issues.  Wife states "there is something wrong with him-what would you do if it was your husband?"  I explained that I would be doing the same thing for my family member.  Patient states, "we'll just go then."  I asked if there was anything else I could do to reassure them, but patient and wife left without being satisfied with care.  Patient does state he is hungry and thinks he will feel better if he eats something and has not eaten all day because in ed. Patient encouraged to improve nutrition.      Hilario Quarry, MD 06/25/12 2024

## 2012-06-25 NOTE — ED Notes (Signed)
Attempted to insert PIV, no success. IV team paged.

## 2012-06-25 NOTE — ED Notes (Signed)
Patient given urinal, states unable to urinate at this time

## 2012-06-25 NOTE — Progress Notes (Signed)
Patient ID: Philip Jimenez, male   DOB: 1961/05/07, 52 y.o.   MRN: [id number]    Subjective: This is a patient who recently underwent a colostomy closure by Dr. Abbey Jimenez on 06-11-12.  He did well and went home on POD#4.  Since then, he has done well until a couple of days ago he developed weakness.  He is eating ok and having normal nonbloody BMs.  He denies any abdominal pain.  He admits to a sore throat for the past couple of mornings, but this goes away throughout the day.  He has no other complaints.  He called our office and was informed to come to the ED for further evaluation.  He denies fevers, but occasional hot flashes and cold chills.  Objective: Vital signs in last 24 hours: Temp:  [98.8 F (37.1 C)] 98.8 F (37.1 C) (01/30 1427) Pulse Rate:  [78] 78  (01/30 1427) Resp:  [20] 20  (01/30 1427) BP: (120)/(83) 120/83 mmHg (01/30 1427) SpO2:  [100 %] 100 % (01/30 1427)    Intake/Output from previous day:   Intake/Output this shift:    PE: Abd: soft, NT, ND, +BS, colostomy wound is clean and packed.  Laparoscopic incisions are healing nicely.  Lab Results:  No results found for this basename: WBC:2,HGB:2,HCT:2,PLT:2 in the last 72 hours BMET No results found for this basename: NA:2,K:2,CL:2,CO2:2,GLUCOSE:2,BUN:2,CREATININE:2,CALCIUM:2 in the last 72 hours PT/INR No results found for this basename: LABPROT:2,INR:2 in the last 72 hours CMP     Component Value Date/Time   NA 137 06/13/2012 1557   K 3.8 06/13/2012 1557   CL 101 06/13/2012 1557   CO2 29 06/13/2012 1557   GLUCOSE 122* 06/13/2012 1557   BUN 9 06/13/2012 1557   CREATININE 0.82 06/13/2012 1557   CALCIUM 8.6 06/13/2012 1557   PROT 7.5 06/02/2012 0930   ALBUMIN 4.0 06/02/2012 0930   AST 18 06/02/2012 0930   ALT 21 06/02/2012 0930   ALKPHOS 91 06/02/2012 0930   BILITOT 0.2* 06/02/2012 0930   GFRNONAA >90 06/13/2012 1557   GFRAA >90 06/13/2012 1557   Lipase     Component Value Date/Time   LIPASE 13 02/08/2012 1001        Studies/Results: No results found.  Anti-infectives: Anti-infectives    None       Assessment/Plan  1. S/p colostomy takedown 2. Weakness  Plan: 1. The patient does not appear to have a post surgical complication causing his weakness.  He has no abdominal pain or complaints about his abdomen.  I have spoken with an EDP and recommended some labs to make sure his electrolytes and hgb is stable.  Otherwise, I do not see any further reason for imaging at this time, unless his WBC comes back very abnormal.  Dr. Abbey Jimenez suggested a couple liters of LR may help as well.  He has a follow up appointment with Dr. Abbey Jimenez on 06-29-12, I believe.  As long as his workup here looks okay, he may follow up with Dr. Abbey Jimenez at that time.  I spoke to he and his wife/significant other and discussed this with them.  Since he has a sore throat, he may have a virus as well that is making him feel weak.  We will follow his work up here in the ED.   LOS: 0 days    Philip Jimenez E 06/25/2012, 2:43 PM Pager: [phone]

## 2012-06-25 NOTE — Progress Notes (Signed)
Pt recent d/c on 06/15/12 home without home health services

## 2012-06-25 NOTE — ED Notes (Signed)
Dr. Abbey Chatters in to see patient and changed his dressing.

## 2012-06-25 NOTE — ED Notes (Signed)
Pt reports reverse colostomy done 2 weeks ago; pt sts weakness since Fri.

## 2012-06-25 NOTE — ED Notes (Signed)
Patient had colonoscopy reversal of colostomy a week ago. Today, patient c/o weakness. Patient denies any blood in his stool.

## 2012-06-25 NOTE — Telephone Encounter (Signed)
Pt's daughter called to ask advised.  Father is just recently home from surgery to close colostomy.  He is very pale and weak.  Any attempts to get him up make him worse.  Family very worried.  Advised pt to return to [facility] ER.  Paged and updated both Dr. Abbey Chatters and Tresa Endo, PA-C.

## 2012-06-25 NOTE — Progress Notes (Signed)
I have seen and examined Philip Jimenez.  He complains of lack of energy for the past 6 days.  No fever.  No abdominal or pelvic pain.  He is tolerating and soft and liquid diet.  He is having three formed BMs per day with no blood.  He does complain of a sore throat, minor cough and bilateral knee pain.  On exam, he is afebrile and is in NAD. CV-RRR Lungs-soft wheezes on right Abdomen-soft, nontender, nondistended, left sided open wound is clean Extr-no edema  Lab reviewed and notable for a moderate leukocytosis and a 4 gram increase in hemoglobin in 10 days.  Will check a CT scan to evaluate for a post operative intra-abdominal infection.  He certainly has evidence of dehydration and IVF have been ordered.

## 2012-06-26 ENCOUNTER — Telehealth (INDEPENDENT_AMBULATORY_CARE_PROVIDER_SITE_OTHER): Payer: Self-pay

## 2012-06-26 ENCOUNTER — Encounter (INDEPENDENT_AMBULATORY_CARE_PROVIDER_SITE_OTHER): Payer: Self-pay | Admitting: General Surgery

## 2012-06-26 NOTE — Telephone Encounter (Signed)
Spoke with pt today.  He is feeling much better, and will be able to come for his post op on 06/29/12.

## 2012-06-26 NOTE — Progress Notes (Signed)
Patient ID: Philip Jimenez, male   DOB: 13-Dec-1960, 52 y.o.   MRN: [id number] He is feeling better today.  CT was negative for post op abscess.  He has been more active than recommended and he understands that he needs to slow down.

## 2012-06-29 ENCOUNTER — Ambulatory Visit (INDEPENDENT_AMBULATORY_CARE_PROVIDER_SITE_OTHER): Payer: 59 | Admitting: General Surgery

## 2012-06-29 ENCOUNTER — Encounter (INDEPENDENT_AMBULATORY_CARE_PROVIDER_SITE_OTHER): Payer: Self-pay | Admitting: General Surgery

## 2012-06-29 VITALS — BP 120/72 | HR 72 | Temp 97.8°F | Resp 12 | Ht 70.0 in | Wt 185.6 lb

## 2012-06-29 DIAGNOSIS — Z9889 Other specified postprocedural states: Secondary | ICD-10-CM

## 2012-06-29 NOTE — Progress Notes (Signed)
Mr. Lamere is feeling better. He has an upper respiratory infection and that was contributing to his feeling poorly. He is eating better. He is not doing heavy activities as he was before.  Exam: Abdominal open wound is clean and intact.  Impression: Doing much better post laparoscopic assisted colostomy closure 06/11/2012. Does have an upper respiratory infection.  Plan: High fiber diet. Continue wound care. In light activities. Return visit 3 weeks.

## 2012-06-29 NOTE — Patient Instructions (Signed)
Continue light activities. Continue dressing changes as you are doing. High fiber diet as we discussed.

## 2012-07-16 ENCOUNTER — Encounter (INDEPENDENT_AMBULATORY_CARE_PROVIDER_SITE_OTHER): Payer: Self-pay | Admitting: General Surgery

## 2012-07-16 ENCOUNTER — Ambulatory Visit (INDEPENDENT_AMBULATORY_CARE_PROVIDER_SITE_OTHER): Payer: 59 | Admitting: General Surgery

## 2012-07-16 VITALS — BP 110/74 | HR 72 | Resp 18 | Ht 70.0 in | Wt 188.0 lb

## 2012-07-16 DIAGNOSIS — Z9889 Other specified postprocedural states: Secondary | ICD-10-CM

## 2012-07-16 NOTE — Patient Instructions (Signed)
May return to work as we discussed. Continue current wound care and high-fiber diet.

## 2012-07-16 NOTE — Progress Notes (Signed)
Procedure:  Laparoscopic assisted colostomy closure  Date:06/11/2012  History:  He continues to improve. He is eating well. His wound is healing. His bowels are moving. He feels he has enough energy to return to work.  Exam: General- Is in NAD. Abdomen soft and left upper quadrant healing in nicely by secondary intention and is about 95% healed.  Assessment:  Doing well postoperatively  Plan:  Return to light-duty work. Continue current wound care. High fiber diet. Return visit one month.

## 2012-08-19 ENCOUNTER — Encounter (INDEPENDENT_AMBULATORY_CARE_PROVIDER_SITE_OTHER): Payer: Self-pay | Admitting: General Surgery

## 2012-08-19 ENCOUNTER — Ambulatory Visit (INDEPENDENT_AMBULATORY_CARE_PROVIDER_SITE_OTHER): Payer: 59 | Admitting: General Surgery

## 2012-08-19 VITALS — BP 126/70 | HR 76 | Temp 97.7°F | Resp 16 | Ht 70.0 in | Wt 190.0 lb

## 2012-08-19 DIAGNOSIS — Z9889 Other specified postprocedural states: Secondary | ICD-10-CM

## 2012-08-19 NOTE — Patient Instructions (Signed)
Activities as tolerated as we discussed. Avoid repetitive heavy activity or heavy lifting.

## 2012-08-19 NOTE — Progress Notes (Signed)
Procedure:  Laparoscopic assisted colostomy closure  Date:06/11/2012  History:  He returns for another visit. He is feeling well. His bowels are moving. The wound has healed.  Exam: General- Is in NAD. Abdomen soft and left upper quadrant wound is healed.  Assessment:  He continues to do well and the wound has healed.  Plan:  Activities as tolerated. We discussed what this means. Continue high-fiber diet.  Return visit as needed.

## 2012-12-15 ENCOUNTER — Encounter (INDEPENDENT_AMBULATORY_CARE_PROVIDER_SITE_OTHER): Payer: Self-pay

## 2014-03-10 ENCOUNTER — Ambulatory Visit (INDEPENDENT_AMBULATORY_CARE_PROVIDER_SITE_OTHER): Payer: 59 | Admitting: Family Medicine

## 2014-03-10 ENCOUNTER — Telehealth: Payer: Self-pay

## 2014-03-10 VITALS — BP 140/80 | HR 74 | Temp 98.0°F | Resp 16 | Ht 71.0 in | Wt 183.6 lb

## 2014-03-10 DIAGNOSIS — S0501XA Injury of conjunctiva and corneal abrasion without foreign body, right eye, initial encounter: Secondary | ICD-10-CM

## 2014-03-10 MED ORDER — TOBRAMYCIN-DEXAMETHASONE 0.3-0.1 % OP SUSP: 1.0000 [drp] | Freq: Four times a day (QID) | OPHTHALMIC | Status: AC

## 2014-03-10 NOTE — Patient Instructions (Signed)
Corneal Abrasion °The cornea is the clear covering at the front and center of the eye. When looking at the colored portion of the eye (iris), you are looking through the cornea. This very thin tissue is made up of many layers. The surface layer is a single layer of cells (corneal epithelium) and is one of the most sensitive tissues in the body. If a scratch or injury causes the corneal epithelium to come off, it is called a corneal abrasion. If the injury extends to the tissues below the epithelium, the condition is called a corneal ulcer. °CAUSES  °· Scratches. °· Trauma. °· Foreign body in the eye. °Some people have recurrences of abrasions in the area of the original injury even after it has healed (recurrent erosion syndrome). Recurrent erosion syndrome generally improves and goes away with time. °SYMPTOMS  °· Eye pain. °· Difficulty or inability to keep the injured eye open. °· The eye becomes very sensitive to light. °· Recurrent erosions tend to happen suddenly, first thing in the morning, usually after waking up and opening the eye. °DIAGNOSIS  °Your health care provider can diagnose a corneal abrasion during an eye exam. Dye is usually placed in the eye using a drop or a small paper strip moistened by your tears. When the eye is examined with a special light, the abrasion shows up clearly because of the dye. °TREATMENT  °· Small abrasions may be treated with antibiotic drops or ointment alone. °· A pressure patch may be put over the eye. If this is done, follow your doctor's instructions for when to remove the patch. Do not drive or use machines while the eye patch is on. Judging distances is hard to do with a patch on. °If the abrasion becomes infected and spreads to the deeper tissues of the cornea, a corneal ulcer can result. This is serious because it can cause corneal scarring. Corneal scars interfere with light passing through the cornea and cause a loss of vision in the involved eye. °HOME CARE  INSTRUCTIONS °· Use medicine or ointment as directed. Only take over-the-counter or prescription medicines for pain, discomfort, or fever as directed by your health care provider. °· Do not drive or operate machinery if your eye is patched. Your ability to judge distances is impaired. °· If your health care provider has given you a follow-up appointment, it is very important to keep that appointment. Not keeping the appointment could result in a severe eye infection or permanent loss of vision. If there is any problem keeping the appointment, let your health care provider know. °SEEK MEDICAL CARE IF:  °· You have pain, light sensitivity, and a scratchy feeling in one eye or both eyes. °· Your pressure patch keeps loosening up, and you can blink your eye under the patch after treatment. °· Any kind of discharge develops from the eye after treatment or if the lids stick together in the morning. °· You have the same symptoms in the morning as you did with the original abrasion days, weeks, or months after the abrasion healed. °MAKE SURE YOU:  °· Understand these instructions. °· Will watch your condition. °· Will get help right away if you are not doing well or get worse. °Document Released: 05/10/2000 Document Revised: 05/18/2013 Document Reviewed: 01/18/2013 °[employer]® Patient Information ©2015 [employer]. This information is not intended to replace advice given to you by your health care provider. Make sure you discuss any questions you have with your health care provider. ° °

## 2014-03-10 NOTE — Telephone Encounter (Signed)
If pain persists over night, refer to ophthalmology

## 2014-03-10 NOTE — Telephone Encounter (Signed)
Please advise 

## 2014-03-10 NOTE — Telephone Encounter (Signed)
Pt notified and is agreeable with this plan.

## 2014-03-10 NOTE — Telephone Encounter (Signed)
Pt called and states he was just here and his eye is still bothering him. Dr Milus GlazierLauenstein told him to call back if it was still bothering him and he would call him in something. He uses [facility] on [email or web address].He can be reached [phone]. Thank you

## 2014-03-10 NOTE — Progress Notes (Signed)
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 The discomfort in his right eye ever since with some photophobia and foreign body sensation. His eyes are tearing.  Objective: No acute distress Right eye is injected with tearing. Ophthalmological exam: No obvious laceration or foreign body Lid everted and no foreign body noted although the peristalsis was swabbed.  proparacaine was inserted into the eye with complete resolution of pain and then the eye was stained with fluorescein revealing a very slight corneal abrasion in the right upper area. Prednisone eyedrops for instilled as well as a Tobrex eye drop and patient was told to protect his eyes today with safety glasses.  Plan: Patient is to call me if the pain returns, otherwise normal protective care  Signed, Sheila OatsKurt Kaslyn Richburg M.D.

## 2015-01-02 IMAGING — CR DG CHEST 2V
2 series · 2 of 2 positions shown · non-contrast
Comparison: 06/02/2012

CLINICAL DATA: Weakness, shortness of breath.

CHEST - 2 VIEW

[w chest pa]
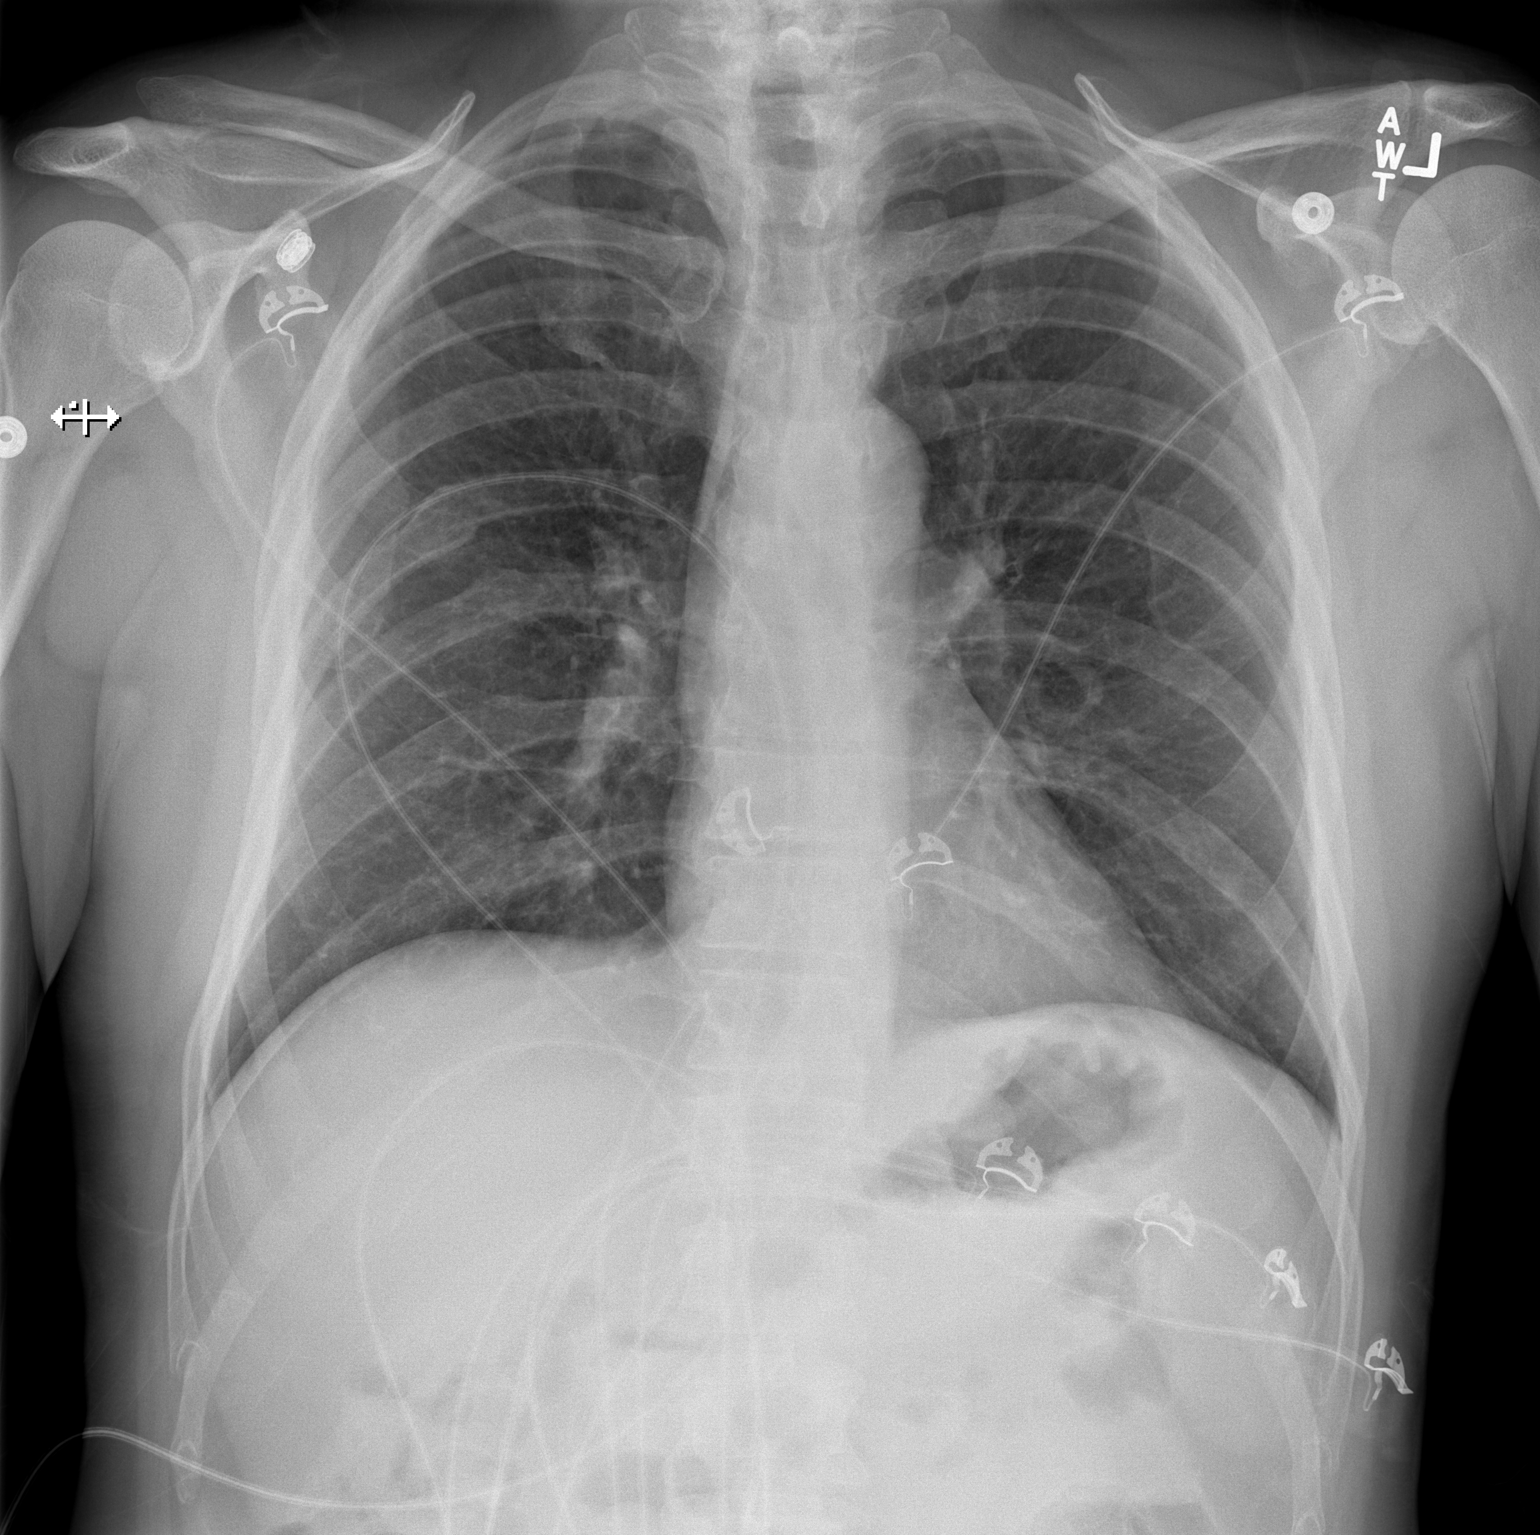

[w chest lat]
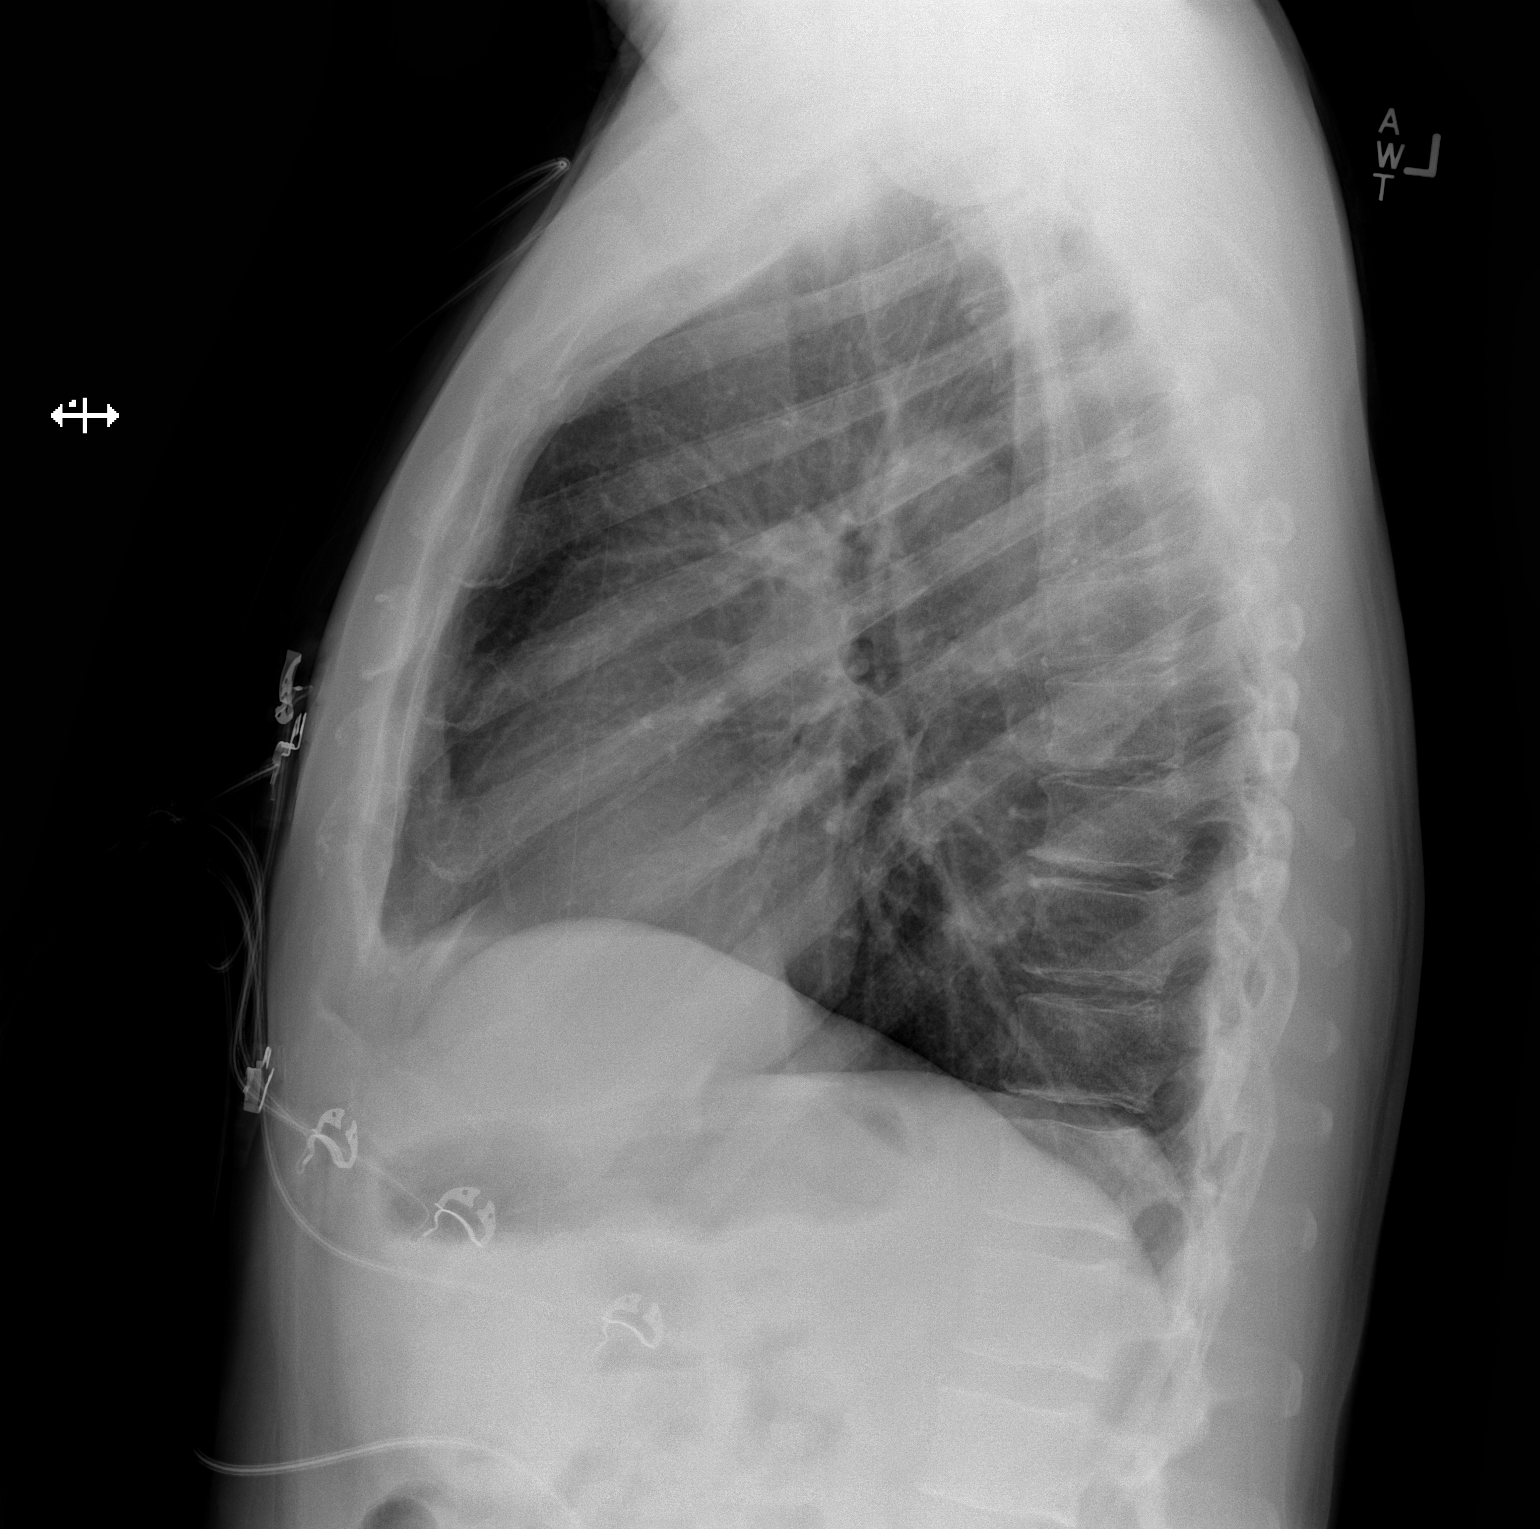

[2 of 2 positions shown; findings below may reference images not displayed]

FINDINGS: Heart and mediastinal contours are within normal limits.
No focal opacities or effusions.  No acute bony abnormality.  Old
right rib fractures, stable.
IMPRESSION: No active cardiopulmonary disease.

## 2015-01-02 IMAGING — CT CT ABD-PELV W/ CM
1 of 3 series · 13 of 32 positions shown, 18 images · IV contrast (OMNIPAQUE 300)
Comparison: Prior CT abdomen/pelvis 02/03/2012

CLINICAL DATA: Leukocytosis status post colostomy takedown on
06/11/2012

CT ABDOMEN AND PELVIS WITH CONTRAST
TECHNIQUE: Multidetector CT imaging of the abdomen and pelvis was
performed following the standard protocol during bolus
administration of intravenous contrast.
Contrast: 100mL OMNIPAQUE IOHEXOL 300 MG/ML  SOLN

[Series 2: abd/pel with · axial · 0.84mm/px · z∈[+1018,+1444]mm · 13 of 96 slices shown, 18 images]
[im 6/96  soft-tissue]
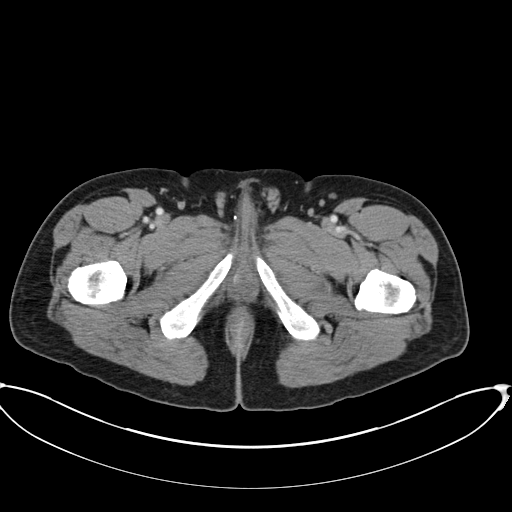
[im 6/96  bone]
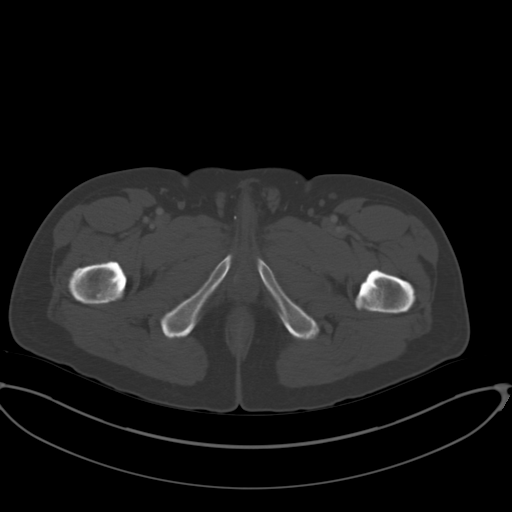
[im 16/96  soft-tissue]
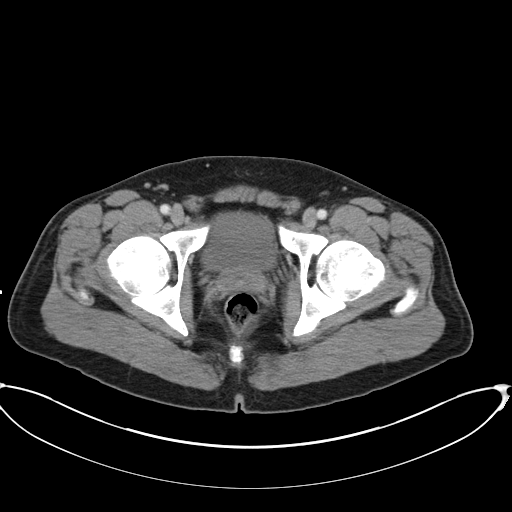
[im 21/96  soft-tissue]
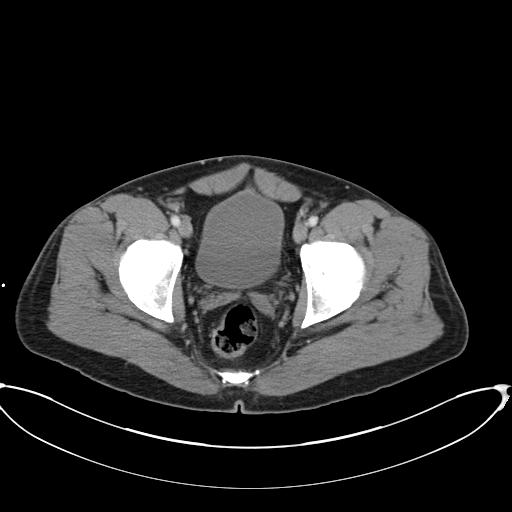
[im 31/96  soft-tissue]
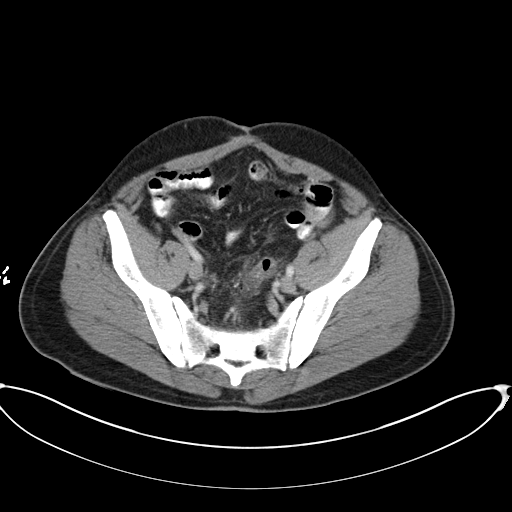
[im 36/96  soft-tissue]
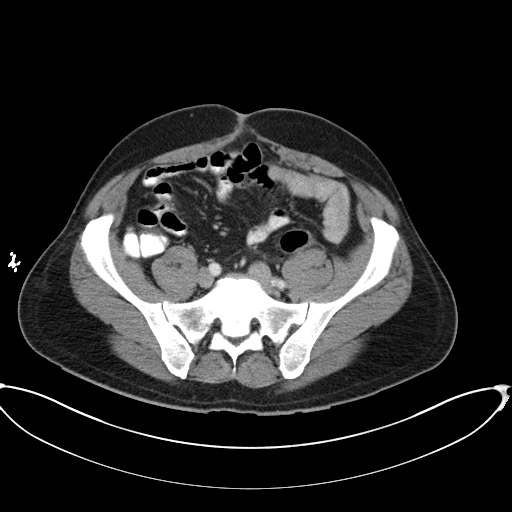
[im 46/96  soft-tissue]
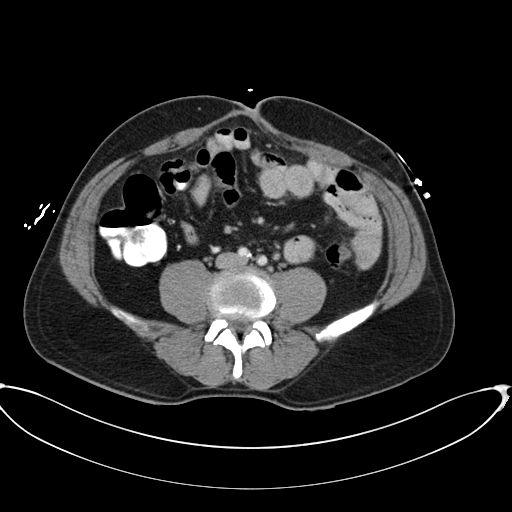
[im 51/96  soft-tissue]
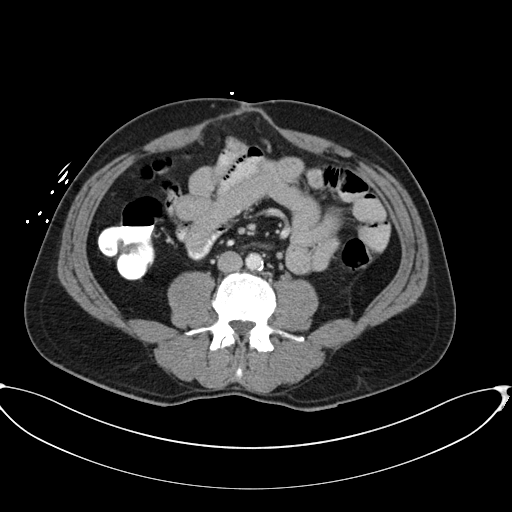
[im 61/96  soft-tissue]
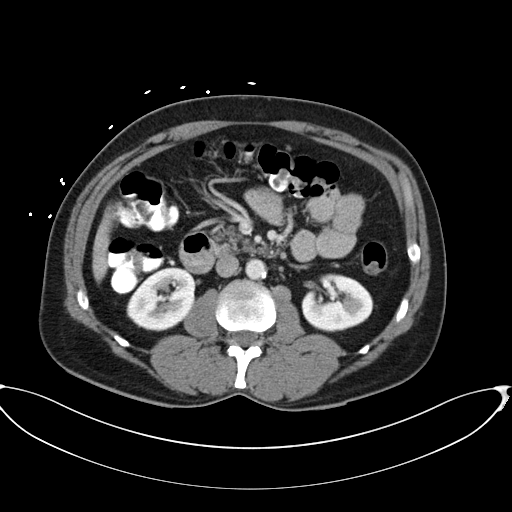
[im 66/96  soft-tissue]
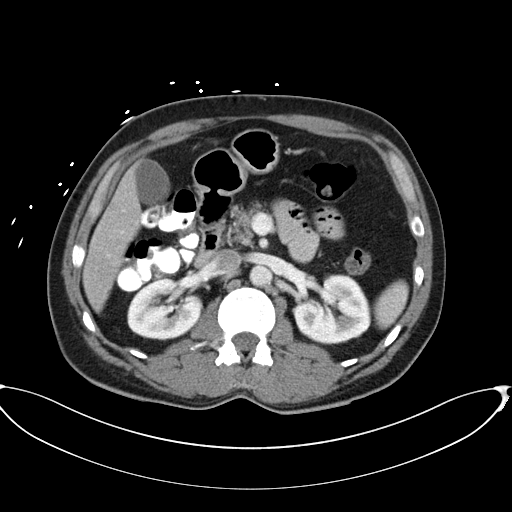
[im 66/96  bone]
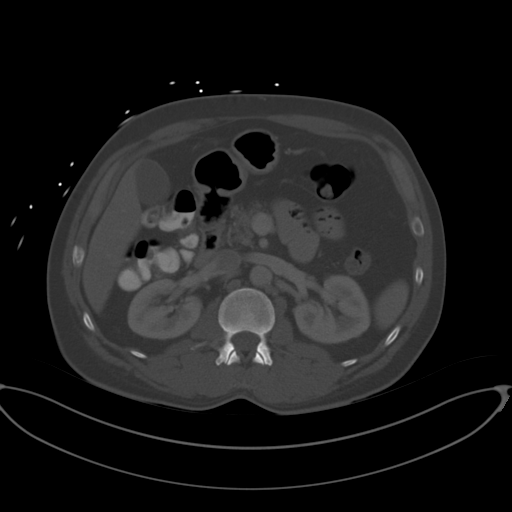
[im 76/96  soft-tissue]
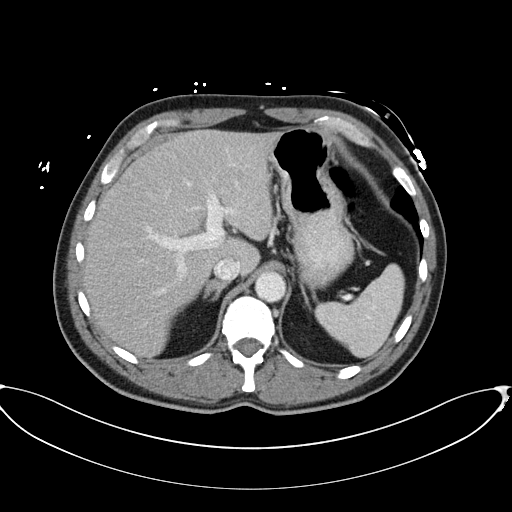
[im 76/96  lung]
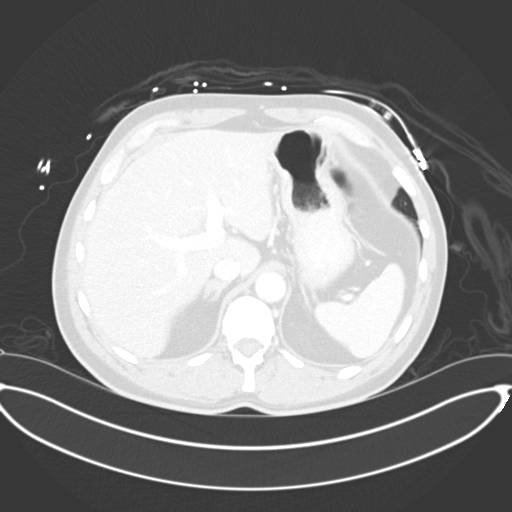
[im 81/96  soft-tissue]
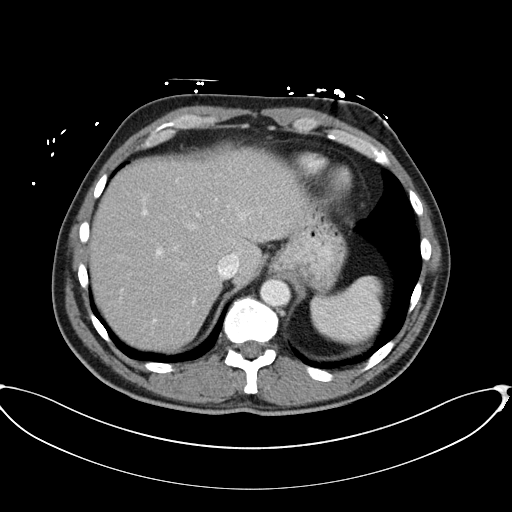
[im 81/96  lung]
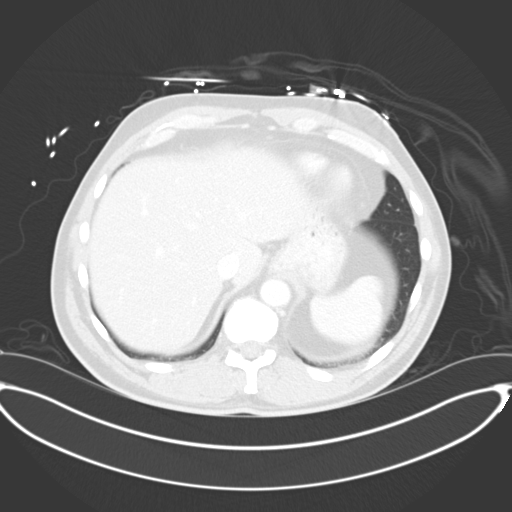
[im 86/96  lung]
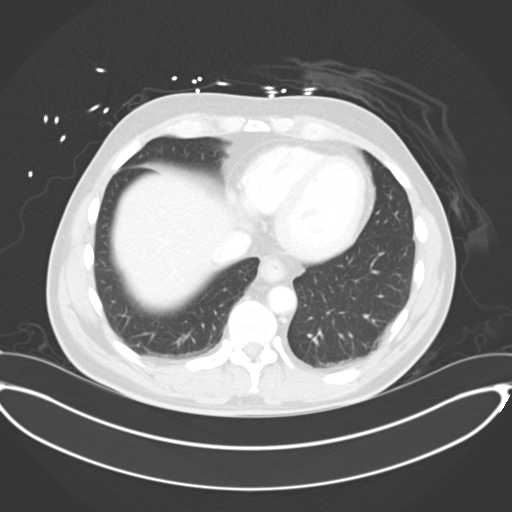
[im 91/96  soft-tissue]
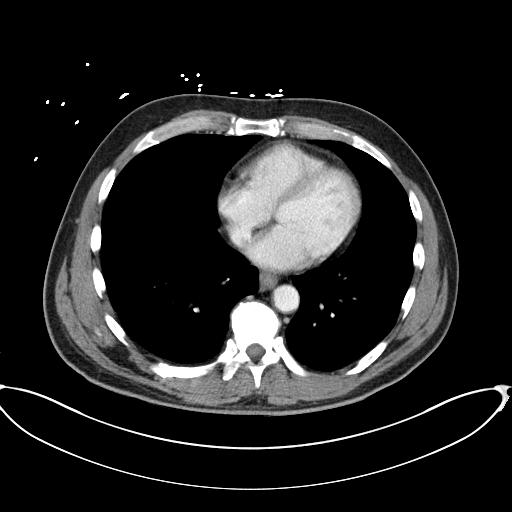
[im 91/96  lung]
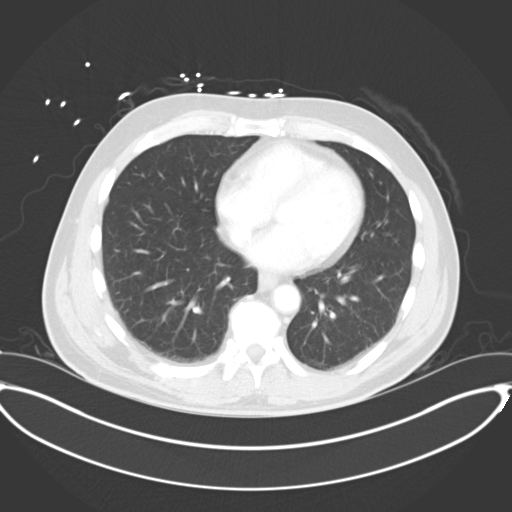

[13 of 32 positions shown; findings below may reference images not displayed]

FINDINGS: Lower Chest:  The lung bases are clear.  Visualized cardiac
structures within normal limits for size.  No pericardial effusion.
Small hiatal hernia noted.

Abdomen: Unremarkable CT appearance of the stomach, duodenum,
spleen and adrenal glands.  Fatty atrophy of the pancreatic head
and neck noted.  No focal pancreatic lesion or duct dilatation.  No
focal hepatic lesion.  Gallbladder is unremarkable. No intra or
extrahepatic biliary ductal dilatation..

Stable 2.4 cm cyst exophytic from the lower pole of the right
kidney.  Additional tiny sub centimeter low attenuation lesions
bilaterally are too small for accurate characterization but
statistically highly likely to represent benign cysts.  Focal
cortical loss in the upper pole of the right kidney consistent with
an area of prior scarring or the sequela of reflux is unchanged
compared to prior.

Normal-caliber large and small bowel throughout the abdomen.  No
evidence of bowel obstruction.  Surgical changes of prior
sigmoidectomy with a cold colonic anastomosis in the anatomic
pelvis.  The anastomosis appears intact and patent.  There is mild
interstitial stranding of the pericolonic fat but no evidence of
free fluid or free air.  There are scattered colonic diverticula
without evidence of active inflammation.  Normal appendix
identified in the right lower quadrant.  No free fluid or
suspicious adenopathy. Healing ostomy site in the left lower
quadrant anterior abdominal wall.  No focal fluid collection to
suggest abscess.

Pelvis: Unremarkable CT appearance of the bladder.  Coarse central
prostatic calcifications.  Unremarkable appearance of the seminal
vesicles.  No free fluid or suspicious adenopathy.

Bones: No acute fracture or aggressive appearing lytic or blastic
osseous lesion.

Vascular: Scattered atherosclerotic vascular calcifications
throughout the infrarenal abdominal aorta without aneurysmal
dilatation or significant appearing stenosis.
IMPRESSION: Surgical changes consistent with the clinical history of recent
left lower quadrant colostomy takedown and primary colocolonic
anastomosis without evidence of complication.  Mild stranding in
the pericolonic fat at the anastomosis site is favored to represent
postsurgical change.  There is no free fluid, abscess or evidence
of free air.

## 2021-12-20 ENCOUNTER — Other Ambulatory Visit: Payer: Self-pay | Admitting: Internal Medicine

## 2021-12-20 DIAGNOSIS — R0609 Other forms of dyspnea: Secondary | ICD-10-CM

## 2022-01-16 ENCOUNTER — Other Ambulatory Visit: Payer: Self-pay

## 2022-03-11 ENCOUNTER — Other Ambulatory Visit (HOSPITAL_COMMUNITY): Payer: Self-pay | Admitting: Internal Medicine

## 2022-03-11 DIAGNOSIS — R0609 Other forms of dyspnea: Secondary | ICD-10-CM

## 2022-03-13 ENCOUNTER — Other Ambulatory Visit: Payer: Self-pay

## 2022-03-15 ENCOUNTER — Encounter (HOSPITAL_COMMUNITY): Payer: Self-pay

## 2022-03-15 ENCOUNTER — Ambulatory Visit (HOSPITAL_COMMUNITY): Payer: No Typology Code available for payment source

## 2023-12-26 ENCOUNTER — Other Ambulatory Visit (HOSPITAL_BASED_OUTPATIENT_CLINIC_OR_DEPARTMENT_OTHER): Payer: Self-pay | Admitting: Internal Medicine

## 2023-12-26 DIAGNOSIS — Z136 Encounter for screening for cardiovascular disorders: Secondary | ICD-10-CM

## 2024-01-12 ENCOUNTER — Ambulatory Visit (HOSPITAL_BASED_OUTPATIENT_CLINIC_OR_DEPARTMENT_OTHER)
Admission: RE | Admit: 2024-01-12 | Discharge: 2024-01-12 | Disposition: A | Discharge status: Home / Self Care | Payer: Self-pay | Source: Ambulatory Visit | Attending: Internal Medicine | Admitting: Internal Medicine

## 2024-01-12 DIAGNOSIS — Z136 Encounter for screening for cardiovascular disorders: Secondary | ICD-10-CM | POA: Insufficient documentation
